# Patient Record
Sex: Male | Born: 1985 | Race: Black or African American | Hispanic: No | Marital: Single | Smoking: Smoker, current status unknown
Health system: Southern US, Community
[De-identification: ages and names within clinical notes are randomized; demographics above are authoritative.]

## PROBLEM LIST (undated history)

## (undated) DIAGNOSIS — F17200 Nicotine dependence, unspecified, uncomplicated: Secondary | ICD-10-CM

---
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
## 2006-02-07 ENCOUNTER — Emergency Department (HOSPITAL_COMMUNITY): Admission: EM | Admit: 2006-02-07 | Discharge: 2006-02-07 | Payer: Self-pay | Admitting: Emergency Medicine

## 2007-05-26 ENCOUNTER — Emergency Department (HOSPITAL_COMMUNITY): Admission: EM | Admit: 2007-05-26 | Discharge: 2007-05-26 | Payer: Self-pay | Admitting: Family Medicine

## 2010-06-15 NOTE — Op Note (Signed)
[place]  Patient:    Jeffrey Haney, Jeffrey Haney                      MRN: [id number] Proc. Date: 05/09/00 Adm. Date:  [id number] Attending:  Carlean Purl                           Operative Report  PREOPERATIVE DIAGNOSIS:  Adenoid hypertrophy with obstructive symptoms.  POSTOPERATIVE DIAGNOSIS:  Adenoid hypertrophy with obstructive symptoms.  PROCEDURE:  Adenoidectomy.  SURGEON:  Kristine Garbe. Ezzard Standing, M.D.  ANESTHESIA:  General endotracheal.  COMPLICATIONS:  None.  BRIEF CLINICAL NOTE:  Jeffrey Haney is a 25 year old male who has had heavy snoring at night, sometimes obstructed breathing at night.  On exam, he has average 2+ size tonsils.  Nasal endoscopy was performed and revealed large obstructing adenoid tissue.  He is taken to the operating room at this time for adenoidectomy.  DESCRIPTION OF PROCEDURE:  After adequate endotracheal anesthesia, [name] received 500 mg of Ancef IV preoperatively.  The mouth gag was used to visualize the oropharynx.  A red rubber catheter was passed through the nose and out the mouth to retract the soft palate, and the nasopharynx was examined.  [name] had large obstructing adenoid tissue.  A large adenoid curette was used to remove the central pad of adenoid tissue.  Nasopharyngeal pack was placed for hemostasis.  This was then removed, and further hemostasis was obtained with suction cautery.  After obtaining adequate hemostasis, the procedure was completed.  [name] was awoken from anesthesia and transferred to recovery postop doing well.  DISPOSITION:  [name] is discharged home later this morning on Tylenol and Tylenol No. 3 p.r.n. pain.  Will have him follow up in my office in two weeks for recheck. DD:  05/09/00 TD:  05/09/00 Job: [id number]

## 2017-08-04 ENCOUNTER — Encounter (HOSPITAL_COMMUNITY): Payer: Self-pay | Admitting: Emergency Medicine

## 2017-08-04 ENCOUNTER — Inpatient Hospital Stay (HOSPITAL_COMMUNITY)
Admission: EM | Admit: 2017-08-04 | Discharge: 2017-08-26 | DRG: 957 | Disposition: A | Discharge status: Home / Self Care | Payer: Self-pay | Attending: General Surgery | Admitting: General Surgery

## 2017-08-04 ENCOUNTER — Other Ambulatory Visit: Payer: Self-pay

## 2017-08-04 DIAGNOSIS — N179 Acute kidney failure, unspecified: Secondary | ICD-10-CM

## 2017-08-04 DIAGNOSIS — S36892A Contusion of other intra-abdominal organs, initial encounter: Secondary | ICD-10-CM | POA: Diagnosis present

## 2017-08-04 DIAGNOSIS — S299XXA Unspecified injury of thorax, initial encounter: Secondary | ICD-10-CM

## 2017-08-04 DIAGNOSIS — J9690 Respiratory failure, unspecified, unspecified whether with hypoxia or hypercapnia: Secondary | ICD-10-CM

## 2017-08-04 DIAGNOSIS — S36039A Unspecified laceration of spleen, initial encounter: Secondary | ICD-10-CM | POA: Diagnosis present

## 2017-08-04 DIAGNOSIS — T148XXA Other injury of unspecified body region, initial encounter: Secondary | ICD-10-CM

## 2017-08-04 DIAGNOSIS — Y838 Other surgical procedures as the cause of abnormal reaction of the patient, or of later complication, without mention of misadventure at the time of the procedure: Secondary | ICD-10-CM | POA: Diagnosis not present

## 2017-08-04 DIAGNOSIS — J80 Acute respiratory distress syndrome: Secondary | ICD-10-CM

## 2017-08-04 DIAGNOSIS — Z720 Tobacco use: Secondary | ICD-10-CM

## 2017-08-04 DIAGNOSIS — S36116A Major laceration of liver, initial encounter: Secondary | ICD-10-CM | POA: Diagnosis present

## 2017-08-04 DIAGNOSIS — S37812A Contusion of adrenal gland, initial encounter: Secondary | ICD-10-CM | POA: Diagnosis present

## 2017-08-04 DIAGNOSIS — R402362 Coma scale, best motor response, obeys commands, at arrival to emergency department: Secondary | ICD-10-CM | POA: Diagnosis present

## 2017-08-04 DIAGNOSIS — S27322A Contusion of lung, bilateral, initial encounter: Secondary | ICD-10-CM | POA: Diagnosis present

## 2017-08-04 DIAGNOSIS — L8990 Pressure ulcer of unspecified site, unspecified stage: Secondary | ICD-10-CM

## 2017-08-04 DIAGNOSIS — Z0189 Encounter for other specified special examinations: Secondary | ICD-10-CM

## 2017-08-04 DIAGNOSIS — Z978 Presence of other specified devices: Secondary | ICD-10-CM

## 2017-08-04 DIAGNOSIS — S272XXA Traumatic hemopneumothorax, initial encounter: Principal | ICD-10-CM | POA: Diagnosis present

## 2017-08-04 DIAGNOSIS — S20319A Abrasion of unspecified front wall of thorax, initial encounter: Secondary | ICD-10-CM | POA: Diagnosis present

## 2017-08-04 DIAGNOSIS — S2242XA Multiple fractures of ribs, left side, initial encounter for closed fracture: Secondary | ICD-10-CM | POA: Diagnosis present

## 2017-08-04 DIAGNOSIS — K913 Postprocedural intestinal obstruction, unspecified as to partial versus complete: Secondary | ICD-10-CM | POA: Diagnosis not present

## 2017-08-04 DIAGNOSIS — S61412A Laceration without foreign body of left hand, initial encounter: Secondary | ICD-10-CM | POA: Diagnosis present

## 2017-08-04 DIAGNOSIS — Z9289 Personal history of other medical treatment: Secondary | ICD-10-CM

## 2017-08-04 DIAGNOSIS — R402252 Coma scale, best verbal response, oriented, at arrival to emergency department: Secondary | ICD-10-CM | POA: Diagnosis present

## 2017-08-04 DIAGNOSIS — F17210 Nicotine dependence, cigarettes, uncomplicated: Secondary | ICD-10-CM | POA: Diagnosis present

## 2017-08-04 DIAGNOSIS — E876 Hypokalemia: Secondary | ICD-10-CM

## 2017-08-04 DIAGNOSIS — R402142 Coma scale, eyes open, spontaneous, at arrival to emergency department: Secondary | ICD-10-CM | POA: Diagnosis present

## 2017-08-04 DIAGNOSIS — D62 Acute posthemorrhagic anemia: Secondary | ICD-10-CM

## 2017-08-04 DIAGNOSIS — Z23 Encounter for immunization: Secondary | ICD-10-CM

## 2017-08-04 DIAGNOSIS — F10239 Alcohol dependence with withdrawal, unspecified: Secondary | ICD-10-CM | POA: Diagnosis present

## 2017-08-04 DIAGNOSIS — S31109A Unspecified open wound of abdominal wall, unspecified quadrant without penetration into peritoneal cavity, initial encounter: Secondary | ICD-10-CM

## 2017-08-04 DIAGNOSIS — R1312 Dysphagia, oropharyngeal phase: Secondary | ICD-10-CM | POA: Diagnosis not present

## 2017-08-04 DIAGNOSIS — F1010 Alcohol abuse, uncomplicated: Secondary | ICD-10-CM

## 2017-08-04 DIAGNOSIS — I10 Essential (primary) hypertension: Secondary | ICD-10-CM

## 2017-08-04 DIAGNOSIS — R578 Other shock: Secondary | ICD-10-CM | POA: Diagnosis not present

## 2017-08-04 DIAGNOSIS — S36113A Laceration of liver, unspecified degree, initial encounter: Secondary | ICD-10-CM

## 2017-08-04 DIAGNOSIS — E874 Mixed disorder of acid-base balance: Secondary | ICD-10-CM | POA: Diagnosis not present

## 2017-08-04 DIAGNOSIS — A412 Sepsis due to unspecified staphylococcus: Secondary | ICD-10-CM | POA: Diagnosis not present

## 2017-08-04 DIAGNOSIS — J939 Pneumothorax, unspecified: Secondary | ICD-10-CM

## 2017-08-04 DIAGNOSIS — F419 Anxiety disorder, unspecified: Secondary | ICD-10-CM | POA: Diagnosis not present

## 2017-08-04 DIAGNOSIS — R000 Tachycardia, unspecified: Secondary | ICD-10-CM

## 2017-08-04 DIAGNOSIS — D696 Thrombocytopenia, unspecified: Secondary | ICD-10-CM | POA: Diagnosis present

## 2017-08-04 DIAGNOSIS — E870 Hyperosmolality and hypernatremia: Secondary | ICD-10-CM | POA: Diagnosis not present

## 2017-08-04 DIAGNOSIS — G9341 Metabolic encephalopathy: Secondary | ICD-10-CM | POA: Diagnosis not present

## 2017-08-04 DIAGNOSIS — S3600XA Unspecified injury of spleen, initial encounter: Secondary | ICD-10-CM

## 2017-08-04 DIAGNOSIS — R680 Hypothermia, not associated with low environmental temperature: Secondary | ICD-10-CM | POA: Diagnosis not present

## 2017-08-04 DIAGNOSIS — J15212 Pneumonia due to Methicillin resistant Staphylococcus aureus: Secondary | ICD-10-CM | POA: Diagnosis not present

## 2017-08-04 DIAGNOSIS — R0682 Tachypnea, not elsewhere classified: Secondary | ICD-10-CM

## 2017-08-04 DIAGNOSIS — R0902 Hypoxemia: Secondary | ICD-10-CM

## 2017-08-04 DIAGNOSIS — D72829 Elevated white blood cell count, unspecified: Secondary | ICD-10-CM

## 2017-08-04 HISTORY — DX: Nicotine dependence, unspecified, uncomplicated: F17.200

## 2017-08-04 LAB — CBC
HCT: 36.4 % — ABNORMAL LOW (ref 39.0–52.0)
HEMOGLOBIN: 11.7 g/dL — AB (ref 13.0–17.0)
MCH: 31.4 pg (ref 26.0–34.0)
MCHC: 32.1 g/dL (ref 30.0–36.0)
MCV: 97.6 fL (ref 78.0–100.0)
Platelets: 199 10*3/uL (ref 150–400)
RBC: 3.73 MIL/uL — ABNORMAL LOW (ref 4.22–5.81)
RDW: 13.5 % (ref 11.5–15.5)
WBC: 9.7 10*3/uL (ref 4.0–10.5)

## 2017-08-04 LAB — I-STAT CHEM 8, ED
BUN: 20 mg/dL (ref 6–20)
CHLORIDE: 105 mmol/L (ref 98–111)
CREATININE: 1.4 mg/dL — AB (ref 0.61–1.24)
Calcium, Ion: 1.02 mmol/L — ABNORMAL LOW (ref 1.15–1.40)
Glucose, Bld: 152 mg/dL — ABNORMAL HIGH (ref 70–99)
HCT: 36 % — ABNORMAL LOW (ref 39.0–52.0)
Hemoglobin: 12.2 g/dL — ABNORMAL LOW (ref 13.0–17.0)
POTASSIUM: 2.7 mmol/L — AB (ref 3.5–5.1)
Sodium: 141 mmol/L (ref 135–145)
TCO2: 20 mmol/L — ABNORMAL LOW (ref 22–32)

## 2017-08-04 LAB — I-STAT CG4 LACTIC ACID, ED: Lactic Acid, Venous: 3.59 mmol/L (ref 0.5–1.9)

## 2017-08-04 LAB — PROTIME-INR
INR: 1.12
Prothrombin Time: 14.3 seconds (ref 11.4–15.2)

## 2017-08-04 LAB — SAMPLE TO BLOOD BANK

## 2017-08-04 LAB — ETHANOL: Alcohol, Ethyl (B): 213 mg/dL — ABNORMAL HIGH (ref <10)
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
## 2017-08-04 NOTE — ED Notes (Signed)
Critical results given to Dr. Blinda LeatherwoodPollina
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
## 2017-08-05 ENCOUNTER — Emergency Department (HOSPITAL_COMMUNITY): Payer: Self-pay | Admitting: Certified Registered"

## 2017-08-05 ENCOUNTER — Emergency Department (HOSPITAL_COMMUNITY): Payer: Self-pay

## 2017-08-05 ENCOUNTER — Inpatient Hospital Stay (HOSPITAL_COMMUNITY): Payer: Self-pay

## 2017-08-05 ENCOUNTER — Encounter (HOSPITAL_COMMUNITY): Admission: EM | Disposition: A | Discharge status: Home / Self Care | Payer: Self-pay | Source: Home / Self Care

## 2017-08-05 HISTORY — PX: LAPAROTOMY: SHX154

## 2017-08-05 LAB — DIC (DISSEMINATED INTRAVASCULAR COAGULATION)PANEL
INR: 1.21
Platelets: 69 10*3/uL — ABNORMAL LOW (ref 150–400)
Prothrombin Time: 15.2 seconds (ref 11.4–15.2)
Smear Review: NONE SEEN
aPTT: 29 seconds (ref 24–36)

## 2017-08-05 LAB — BASIC METABOLIC PANEL
Anion gap: 14 (ref 5–15)
BUN: 14 mg/dL (ref 6–20)
CHLORIDE: 111 mmol/L (ref 98–111)
CO2: 17 mmol/L — ABNORMAL LOW (ref 22–32)
CREATININE: 1.12 mg/dL (ref 0.61–1.24)
Calcium: 6.5 mg/dL — ABNORMAL LOW (ref 8.9–10.3)
GFR calc Af Amer: >60 mL/min (ref >60)
GLUCOSE: 100 mg/dL — AB (ref 70–99)
Potassium: 3.4 mmol/L — ABNORMAL LOW (ref 3.5–5.1)
SODIUM: 142 mmol/L (ref 135–145)

## 2017-08-05 LAB — POCT I-STAT 7, (LYTES, BLD GAS, ICA,H+H)
ACID-BASE DEFICIT: 15 mmol/L — AB (ref 0.0–2.0)
Acid-base deficit: 9 mmol/L — ABNORMAL HIGH (ref 0.0–2.0)
Bicarbonate: 15.5 mmol/L — ABNORMAL LOW (ref 20.0–28.0)
Bicarbonate: 18.8 mmol/L — ABNORMAL LOW (ref 20.0–28.0)
CALCIUM ION: 0.54 mmol/L — AB (ref 1.15–1.40)
CALCIUM ION: 0.86 mmol/L — AB (ref 1.15–1.40)
HEMATOCRIT: 25 % — AB (ref 39.0–52.0)
HEMATOCRIT: 31 % — AB (ref 39.0–52.0)
Hemoglobin: 8.5 g/dL — ABNORMAL LOW (ref 13.0–17.0)
Hemoglobin: 10.5 g/dL — ABNORMAL LOW (ref 13.0–17.0)
O2 SAT: 100 %
O2 Saturation: 100 %
PCO2 ART: 45.7 mmHg (ref 32.0–48.0)
PH ART: 7.014 — AB (ref 7.350–7.450)
POTASSIUM: 3.6 mmol/L (ref 3.5–5.1)
Potassium: 3.9 mmol/L (ref 3.5–5.1)
SODIUM: 147 mmol/L — AB (ref 135–145)
SODIUM: 145 mmol/L (ref 135–145)
TCO2: 17 mmol/L — AB (ref 22–32)
TCO2: 20 mmol/L — AB (ref 22–32)
pCO2 arterial: 60.9 mmHg — ABNORMAL HIGH (ref 32.0–48.0)
pH, Arterial: 7.212 — ABNORMAL LOW (ref 7.350–7.450)
pO2, Arterial: 323 mmHg — ABNORMAL HIGH (ref 83.0–108.0)
pO2, Arterial: 434 mmHg — ABNORMAL HIGH (ref 83.0–108.0)

## 2017-08-05 LAB — POCT I-STAT 4, (NA,K, GLUC, HGB,HCT)
Glucose, Bld: 138 mg/dL — ABNORMAL HIGH (ref 70–99)
Glucose, Bld: 134 mg/dL — ABNORMAL HIGH (ref 70–99)
HCT: 29 % — ABNORMAL LOW (ref 39.0–52.0)
HEMATOCRIT: 23 % — AB (ref 39.0–52.0)
HEMOGLOBIN: 7.8 g/dL — AB (ref 13.0–17.0)
Hemoglobin: 9.9 g/dL — ABNORMAL LOW (ref 13.0–17.0)
Potassium: 3.8 mmol/L (ref 3.5–5.1)
Potassium: 3.5 mmol/L (ref 3.5–5.1)
SODIUM: 145 mmol/L (ref 135–145)
Sodium: 146 mmol/L — ABNORMAL HIGH (ref 135–145)

## 2017-08-05 LAB — URINALYSIS, ROUTINE W REFLEX MICROSCOPIC
BILIRUBIN URINE: NEGATIVE
Glucose, UA: NEGATIVE mg/dL
KETONES UR: NEGATIVE mg/dL
LEUKOCYTES UA: NEGATIVE
NITRITE: NEGATIVE
Protein, ur: 100 mg/dL — AB
RBC / HPF: >50 RBC/hpf — ABNORMAL HIGH (ref 0–5)
SPECIFIC GRAVITY, URINE: 1.018 (ref 1.005–1.030)
pH: 5 (ref 5.0–8.0)

## 2017-08-05 LAB — BLOOD PRODUCT ORDER (VERBAL) VERIFICATION

## 2017-08-05 LAB — CBC
HCT: 37.7 % — ABNORMAL LOW (ref 39.0–52.0)
HCT: 35.8 % — ABNORMAL LOW (ref 39.0–52.0)
HEMATOCRIT: 37.2 % — AB (ref 39.0–52.0)
HEMOGLOBIN: 12.7 g/dL — AB (ref 13.0–17.0)
Hemoglobin: 12.9 g/dL — ABNORMAL LOW (ref 13.0–17.0)
Hemoglobin: 13 g/dL (ref 13.0–17.0)
MCH: 31.4 pg (ref 26.0–34.0)
MCH: 30.4 pg (ref 26.0–34.0)
MCH: 30.9 pg (ref 26.0–34.0)
MCHC: 34.2 g/dL (ref 30.0–36.0)
MCHC: 34.9 g/dL (ref 30.0–36.0)
MCHC: 35.5 g/dL (ref 30.0–36.0)
MCV: 91.7 fL (ref 78.0–100.0)
MCV: 87.1 fL (ref 78.0–100.0)
MCV: 87.1 fL (ref 78.0–100.0)
PLATELETS: 63 10*3/uL — AB (ref 150–400)
Platelets: 116 10*3/uL — ABNORMAL LOW (ref 150–400)
Platelets: 102 10*3/uL — ABNORMAL LOW (ref 150–400)
RBC: 4.11 MIL/uL — ABNORMAL LOW (ref 4.22–5.81)
RBC: 4.27 MIL/uL (ref 4.22–5.81)
RBC: 4.11 MIL/uL — ABNORMAL LOW (ref 4.22–5.81)
RDW: 13.4 % (ref 11.5–15.5)
RDW: 14.1 % (ref 11.5–15.5)
RDW: 14.4 % (ref 11.5–15.5)
WBC: 12.5 10*3/uL — ABNORMAL HIGH (ref 4.0–10.5)
WBC: 8.5 10*3/uL (ref 4.0–10.5)
WBC: 7.1 10*3/uL (ref 4.0–10.5)

## 2017-08-05 LAB — COMPREHENSIVE METABOLIC PANEL
ALK PHOS: 67 U/L (ref 38–126)
ALT: 536 U/L — AB (ref 0–44)
AST: 1096 U/L — ABNORMAL HIGH (ref 15–41)
Albumin: 3.7 g/dL (ref 3.5–5.0)
Anion gap: 10 (ref 5–15)
BUN: 15 mg/dL (ref 6–20)
CHLORIDE: 108 mmol/L (ref 98–111)
CO2: 20 mmol/L — ABNORMAL LOW (ref 22–32)
CREATININE: 1.39 mg/dL — AB (ref 0.61–1.24)
Calcium: 7.9 mg/dL — ABNORMAL LOW (ref 8.9–10.3)
GFR calc non Af Amer: >60 mL/min (ref >60)
GLUCOSE: 155 mg/dL — AB (ref 70–99)
Potassium: 3.7 mmol/L (ref 3.5–5.1)
Sodium: 138 mmol/L (ref 135–145)
Total Bilirubin: 1.6 mg/dL — ABNORMAL HIGH (ref 0.3–1.2)
Total Protein: 6.3 g/dL — ABNORMAL LOW (ref 6.5–8.1)

## 2017-08-05 LAB — DIC (DISSEMINATED INTRAVASCULAR COAGULATION) PANEL
APTT: 29 s (ref 24–36)
FIBRINOGEN: 237 mg/dL (ref 210–475)
FIBRINOGEN: 277 mg/dL (ref 210–475)
INR: 1.2
PLATELETS: 115 10*3/uL — AB (ref 150–400)
PROTHROMBIN TIME: 15.1 s (ref 11.4–15.2)
SMEAR REVIEW: NONE SEEN

## 2017-08-05 LAB — LACTIC ACID, PLASMA
LACTIC ACID, VENOUS: 1.6 mmol/L (ref 0.5–1.9)
Lactic Acid, Venous: 6.7 mmol/L (ref 0.5–1.9)

## 2017-08-05 LAB — POCT I-STAT 3, ART BLOOD GAS (G3+)
Acid-base deficit: 9 mmol/L — ABNORMAL HIGH (ref 0.0–2.0)
Bicarbonate: 18 mmol/L — ABNORMAL LOW (ref 20.0–28.0)
O2 Saturation: 100 %
PCO2 ART: 41.6 mmHg (ref 32.0–48.0)
PH ART: 7.241 — AB (ref 7.350–7.450)
PO2 ART: 385 mmHg — AB (ref 83.0–108.0)
Patient temperature: 97.9
TCO2: 19 mmol/L — ABNORMAL LOW (ref 22–32)

## 2017-08-05 LAB — HIV ANTIBODY (ROUTINE TESTING W REFLEX): HIV Screen 4th Generation wRfx: NONREACTIVE

## 2017-08-05 LAB — MASSIVE TRANSFUSION PROTOCOL ORDER (BLOOD BANK NOTIFICATION)

## 2017-08-05 LAB — TRIGLYCERIDES: TRIGLYCERIDES: 94 mg/dL (ref <150)

## 2017-08-05 LAB — MRSA PCR SCREENING: MRSA BY PCR: NEGATIVE

## 2017-08-05 LAB — ABO/RH: ABO/RH(D): B POS

## 2017-08-05 LAB — CDS SEROLOGY

## 2017-08-05 SURGERY — LAPAROTOMY, EXPLORATORY
Anesthesia: General | Site: Abdomen

## 2017-08-05 MED ORDER — FENTANYL CITRATE (PF) 100 MCG/2ML IJ SOLN
INTRAMUSCULAR | Status: DC | PRN
  Administered 2017-08-05: 100 ug via INTRAVENOUS
  Administered 2017-08-05: 50 ug via INTRAVENOUS
  Administered 2017-08-05: 100 ug via INTRAVENOUS

## 2017-08-05 MED ORDER — SODIUM CHLORIDE 0.9 % IV SOLN
INTRAVENOUS | Status: DC | PRN
  Administered 2017-08-05 (×2): via INTRAVENOUS

## 2017-08-05 MED ORDER — PHENYLEPHRINE 40 MCG/ML (10ML) SYRINGE FOR IV PUSH (FOR BLOOD PRESSURE SUPPORT)
PREFILLED_SYRINGE | INTRAVENOUS | Status: AC
  Filled 2017-08-05: qty 30

## 2017-08-05 MED ORDER — FENTANYL CITRATE (PF) 250 MCG/5ML IJ SOLN
INTRAMUSCULAR | Status: AC
  Filled 2017-08-05: qty 5

## 2017-08-05 MED ORDER — SUCCINYLCHOLINE CHLORIDE 20 MG/ML IJ SOLN
INTRAMUSCULAR | Status: DC | PRN
  Administered 2017-08-05: 70 mg via INTRAVENOUS

## 2017-08-05 MED ORDER — SODIUM BICARBONATE 8.4 % IV SOLN
INTRAVENOUS | Status: DC | PRN
  Administered 2017-08-05: 50 mL via INTRAVENOUS

## 2017-08-05 MED ORDER — FENTANYL CITRATE (PF) 100 MCG/2ML IJ SOLN: 50.0000 ug | Freq: Once | INTRAMUSCULAR | Status: DC

## 2017-08-05 MED ORDER — ROCURONIUM BROMIDE 10 MG/ML (PF) SYRINGE
PREFILLED_SYRINGE | INTRAVENOUS | Status: AC
  Filled 2017-08-05: qty 10

## 2017-08-05 MED ORDER — SUCCINYLCHOLINE CHLORIDE 200 MG/10ML IV SOSY
PREFILLED_SYRINGE | INTRAVENOUS | Status: AC
  Filled 2017-08-05: qty 10

## 2017-08-05 MED ORDER — SODIUM CHLORIDE 0.9% FLUSH
10.0000 mL | INTRAVENOUS | Status: DC | PRN
  Administered 2017-08-10: 10 mL
  Filled 2017-08-05: qty 40

## 2017-08-05 MED ORDER — CHLORHEXIDINE GLUCONATE 0.12 % MT SOLN
OROMUCOSAL | Status: AC
  Filled 2017-08-05: qty 15

## 2017-08-05 MED ORDER — SODIUM CHLORIDE 0.9 % IV SOLN
INTRAVENOUS | Status: DC
  Administered 2017-08-05: 05:00:00 via INTRAVENOUS

## 2017-08-05 MED ORDER — PROPOFOL 1000 MG/100ML IV EMUL
INTRAVENOUS | Status: AC
  Filled 2017-08-05: qty 100

## 2017-08-05 MED ORDER — SODIUM CHLORIDE 0.9% IV SOLUTION: Freq: Once | INTRAVENOUS | Status: DC

## 2017-08-05 MED ORDER — KCL IN DEXTROSE-NACL 20-5-0.45 MEQ/L-%-% IV SOLN
INTRAVENOUS | Status: DC
  Administered 2017-08-05: 20:00:00 via INTRAVENOUS
  Administered 2017-08-05: 100 mL/h via INTRAVENOUS
  Administered 2017-08-06 – 2017-08-09 (×5): via INTRAVENOUS
  Filled 2017-08-05 (×10): qty 1000

## 2017-08-05 MED ORDER — SODIUM CHLORIDE 0.9 % IJ SOLN
INTRAMUSCULAR | Status: AC
  Filled 2017-08-05: qty 10

## 2017-08-05 MED ORDER — ROCURONIUM BROMIDE 100 MG/10ML IV SOLN
INTRAVENOUS | Status: DC | PRN
  Administered 2017-08-05 (×2): 50 mg via INTRAVENOUS

## 2017-08-05 MED ORDER — POTASSIUM CHLORIDE 2 MEQ/ML IV SOLN
INTRAVENOUS | Status: DC
  Filled 2017-08-05: qty 1000

## 2017-08-05 MED ORDER — PROPOFOL 1000 MG/100ML IV EMUL
0.0000 ug/kg/min | INTRAVENOUS | Status: DC
  Administered 2017-08-05: 50 ug/kg/min via INTRAVENOUS

## 2017-08-05 MED ORDER — IOHEXOL 300 MG/ML  SOLN
100.0000 mL | Freq: Once | INTRAMUSCULAR | Status: AC | PRN
  Administered 2017-08-05: 100 mL via INTRAVENOUS

## 2017-08-05 MED ORDER — PHENYLEPHRINE HCL 10 MG/ML IJ SOLN
INTRAMUSCULAR | Status: DC | PRN
  Administered 2017-08-05: 80 ug via INTRAVENOUS

## 2017-08-05 MED ORDER — CEFAZOLIN SODIUM 1 G IJ SOLR
INTRAMUSCULAR | Status: AC
  Filled 2017-08-05: qty 20

## 2017-08-05 MED ORDER — PHENYLEPHRINE HCL-NACL 10-0.9 MG/250ML-% IV SOLN
0.0000 ug/min | INTRAVENOUS | Status: DC
  Administered 2017-08-05: 20 ug/min via INTRAVENOUS
  Administered 2017-08-06: 40 ug/min via INTRAVENOUS
  Filled 2017-08-05 (×3): qty 250

## 2017-08-05 MED ORDER — DIPHENHYDRAMINE HCL 50 MG/ML IJ SOLN
INTRAMUSCULAR | Status: AC
  Filled 2017-08-05: qty 1

## 2017-08-05 MED ORDER — ALBUMIN HUMAN 5 % IV SOLN
25.0000 g | Freq: Once | INTRAVENOUS | Status: AC
  Administered 2017-08-05: 25 g via INTRAVENOUS
  Filled 2017-08-05: qty 250

## 2017-08-05 MED ORDER — BISACODYL 10 MG RE SUPP
10.0000 mg | Freq: Every day | RECTAL | Status: DC | PRN
  Administered 2017-08-13 – 2017-08-18 (×2): 10 mg via RECTAL
  Filled 2017-08-05 (×4): qty 1

## 2017-08-05 MED ORDER — ALBUMIN HUMAN 5 % IV SOLN
25.0000 g | Freq: Once | INTRAVENOUS | Status: AC
  Administered 2017-08-05: 25 g via INTRAVENOUS
  Filled 2017-08-05: qty 500

## 2017-08-05 MED ORDER — 0.9 % SODIUM CHLORIDE (POUR BTL) OPTIME
TOPICAL | Status: DC | PRN
  Administered 2017-08-05 (×2): 1000 mL

## 2017-08-05 MED ORDER — MIDAZOLAM HCL 2 MG/2ML IJ SOLN
INTRAMUSCULAR | Status: AC
  Filled 2017-08-05: qty 2

## 2017-08-05 MED ORDER — HALOPERIDOL LACTATE 5 MG/ML IJ SOLN
5.0000 mg | Freq: Once | INTRAMUSCULAR | Status: AC
  Administered 2017-08-05: 5 mg via INTRAVENOUS
  Filled 2017-08-05: qty 1

## 2017-08-05 MED ORDER — LACTATED RINGERS IV SOLN
INTRAVENOUS | Status: DC | PRN
  Administered 2017-08-05: 03:00:00 via INTRAVENOUS

## 2017-08-05 MED ORDER — POTASSIUM CHLORIDE 10 MEQ/50ML IV SOLN
10.0000 meq | INTRAVENOUS | Status: AC
  Administered 2017-08-05 (×2): 10 meq via INTRAVENOUS
  Filled 2017-08-05 (×2): qty 50

## 2017-08-05 MED ORDER — PROPOFOL 1000 MG/100ML IV EMUL
0.0000 ug/kg/min | INTRAVENOUS | Status: DC
  Administered 2017-08-05 (×5): 50 ug/kg/min via INTRAVENOUS
  Administered 2017-08-06: 35 ug/kg/min via INTRAVENOUS
  Administered 2017-08-06: 50 ug/kg/min via INTRAVENOUS
  Administered 2017-08-06: 25 ug/kg/min via INTRAVENOUS
  Administered 2017-08-06 (×2): 50 ug/kg/min via INTRAVENOUS
  Administered 2017-08-07: 15 ug/kg/min via INTRAVENOUS
  Filled 2017-08-05 (×10): qty 100

## 2017-08-05 MED ORDER — FENTANYL 2500MCG IN NS 250ML (10MCG/ML) PREMIX INFUSION
25.0000 ug/h | INTRAVENOUS | Status: DC
  Administered 2017-08-05: 250 ug/h via INTRAVENOUS
  Administered 2017-08-05: 50 ug/h via INTRAVENOUS
  Administered 2017-08-06: 275 ug/h via INTRAVENOUS
  Administered 2017-08-06 (×2): 250 ug/h via INTRAVENOUS
  Administered 2017-08-07: 400 ug/h via INTRAVENOUS
  Filled 2017-08-05 (×6): qty 250

## 2017-08-05 MED ORDER — DIPHENHYDRAMINE HCL 50 MG/ML IJ SOLN
INTRAMUSCULAR | Status: DC | PRN
  Administered 2017-08-05: 50 mg via INTRAVENOUS

## 2017-08-05 MED ORDER — BACITRACIN ZINC 500 UNIT/GM EX OINT
TOPICAL_OINTMENT | CUTANEOUS | Status: AC
  Filled 2017-08-05: qty 28.35

## 2017-08-05 MED ORDER — LORAZEPAM 2 MG/ML IJ SOLN
1.0000 mg | Freq: Once | INTRAMUSCULAR | Status: AC
Start: 2017-08-05 — End: 2017-08-05
  Administered 2017-08-05: 1 mg via INTRAVENOUS
  Filled 2017-08-05: qty 1

## 2017-08-05 MED ORDER — TETANUS-DIPHTH-ACELL PERTUSSIS 5-2.5-18.5 LF-MCG/0.5 IM SUSP
0.5000 mL | Freq: Once | INTRAMUSCULAR | Status: AC
  Administered 2017-08-05: 0.5 mL via INTRAMUSCULAR
  Filled 2017-08-05: qty 0.5

## 2017-08-05 MED ORDER — ALBUMIN HUMAN 5 % IV SOLN
INTRAVENOUS | Status: AC
  Filled 2017-08-05: qty 250

## 2017-08-05 MED ORDER — SODIUM CHLORIDE 0.9 % IV SOLN
INTRAVENOUS | Status: AC | PRN
  Administered 2017-08-05 (×2): 1000 mL via INTRAVENOUS

## 2017-08-05 MED ORDER — ORAL CARE MOUTH RINSE
15.0000 mL | OROMUCOSAL | Status: DC
  Administered 2017-08-05 – 2017-08-20 (×149): 15 mL via OROMUCOSAL

## 2017-08-05 MED ORDER — PROPOFOL 10 MG/ML IV BOLUS
INTRAVENOUS | Status: AC
  Filled 2017-08-05: qty 20

## 2017-08-05 MED ORDER — SODIUM CHLORIDE 0.9 % IV SOLN
INTRAVENOUS | Status: DC | PRN
  Administered 2017-08-05: 03:00:00 via INTRAVENOUS

## 2017-08-05 MED ORDER — PROPOFOL 10 MG/ML IV BOLUS
INTRAVENOUS | Status: DC | PRN
  Administered 2017-08-05: 30 mg via INTRAVENOUS

## 2017-08-05 MED ORDER — SODIUM CHLORIDE 0.9% FLUSH
10.0000 mL | Freq: Two times a day (BID) | INTRAVENOUS | Status: DC
  Administered 2017-08-05 – 2017-08-06 (×3): 10 mL
  Administered 2017-08-06: 20 mL
  Administered 2017-08-07 (×2): 10 mL
  Administered 2017-08-08: 30 mL
  Administered 2017-08-08 – 2017-08-09 (×2): 10 mL
  Administered 2017-08-09: 30 mL
  Administered 2017-08-12 – 2017-08-18 (×9): 10 mL
  Administered 2017-08-20: 30 mL
  Administered 2017-08-21: 10 mL
  Administered 2017-08-22: 20 mL
  Administered 2017-08-22: 10 mL
  Administered 2017-08-23: 30 mL
  Administered 2017-08-24 – 2017-08-25 (×2): 10 mL

## 2017-08-05 MED ORDER — ONDANSETRON HCL 4 MG/2ML IJ SOLN
4.0000 mg | Freq: Four times a day (QID) | INTRAMUSCULAR | Status: DC | PRN
  Administered 2017-08-20: 4 mg via INTRAVENOUS
  Filled 2017-08-05: qty 2

## 2017-08-05 MED ORDER — DOCUSATE SODIUM 50 MG/5ML PO LIQD
100.0000 mg | Freq: Two times a day (BID) | ORAL | Status: DC | PRN
  Administered 2017-08-09 – 2017-08-17 (×6): 100 mg
  Filled 2017-08-05 (×6): qty 10

## 2017-08-05 MED ORDER — PROPOFOL 500 MG/50ML IV EMUL
INTRAVENOUS | Status: DC | PRN
  Administered 2017-08-05: 50 ug/kg/min via INTRAVENOUS

## 2017-08-05 MED ORDER — FENTANYL BOLUS VIA INFUSION
50.0000 ug | INTRAVENOUS | Status: DC | PRN
  Filled 2017-08-05: qty 50

## 2017-08-05 MED ORDER — CHLORHEXIDINE GLUCONATE 0.12% ORAL RINSE (MEDLINE KIT)
15.0000 mL | Freq: Two times a day (BID) | OROMUCOSAL | Status: DC
  Administered 2017-08-05 – 2017-08-20 (×31): 15 mL via OROMUCOSAL

## 2017-08-05 MED ORDER — CHLORHEXIDINE GLUCONATE CLOTH 2 % EX PADS
6.0000 | MEDICATED_PAD | Freq: Every day | CUTANEOUS | Status: DC
  Administered 2017-08-05 – 2017-08-25 (×18): 6 via TOPICAL

## 2017-08-05 MED ORDER — SODIUM BICARBONATE 8.4 % IV SOLN
INTRAVENOUS | Status: AC
  Filled 2017-08-05: qty 50

## 2017-08-05 MED ORDER — ONDANSETRON 4 MG PO TBDP
4.0000 mg | ORAL_TABLET | Freq: Four times a day (QID) | ORAL | Status: DC | PRN
  Administered 2017-08-22: 4 mg via ORAL
  Filled 2017-08-05: qty 1

## 2017-08-05 MED ORDER — MIDAZOLAM HCL 5 MG/5ML IJ SOLN
INTRAMUSCULAR | Status: DC | PRN
  Administered 2017-08-05 (×2): 2 mg via INTRAVENOUS

## 2017-08-05 SURGICAL SUPPLY — 55 items
BLADE CLIPPER SURG (BLADE) IMPLANT
BNDG CONFORM 3 STRL LF (GAUZE/BANDAGES/DRESSINGS) ×2 IMPLANT
BNDG GAUZE ELAST 4 BULKY (GAUZE/BANDAGES/DRESSINGS) ×2 IMPLANT
CANISTER SUCT 3000ML PPV (MISCELLANEOUS) ×3 IMPLANT
CATH THORACIC 28FR (CATHETERS) ×2 IMPLANT
CHLORAPREP W/TINT 26ML (MISCELLANEOUS) ×3 IMPLANT
COVER SURGICAL LIGHT HANDLE (MISCELLANEOUS) ×3 IMPLANT
DRAPE LAPAROSCOPIC ABDOMINAL (DRAPES) ×3 IMPLANT
DRAPE WARM FLUID 44X44 (DRAPE) ×3 IMPLANT
DRSG OPSITE 6X11 MED (GAUZE/BANDAGES/DRESSINGS) ×2 IMPLANT
DRSG OPSITE POSTOP 4X10 (GAUZE/BANDAGES/DRESSINGS) IMPLANT
DRSG OPSITE POSTOP 4X8 (GAUZE/BANDAGES/DRESSINGS) IMPLANT
DURAPREP 26ML APPLICATOR (WOUND CARE) ×2 IMPLANT
ELECT BLADE 6.5 EXT (BLADE) IMPLANT
ELECT CAUTERY BLADE 6.4 (BLADE) IMPLANT
ELECT REM PT RETURN 9FT ADLT (ELECTROSURGICAL) ×3
ELECTRODE REM PT RTRN 9FT ADLT (ELECTROSURGICAL) ×1 IMPLANT
GAUZE SPONGE 4X4 12PLY STRL (GAUZE/BANDAGES/DRESSINGS) ×4 IMPLANT
GLOVE BIO SURGEON STRL SZ7 (GLOVE) ×3 IMPLANT
GLOVE BIOGEL PI IND STRL 7.0 (GLOVE) IMPLANT
GLOVE BIOGEL PI IND STRL 7.5 (GLOVE) ×1 IMPLANT
GLOVE BIOGEL PI INDICATOR 7.0 (GLOVE) ×8
GLOVE BIOGEL PI INDICATOR 7.5 (GLOVE) ×2
GOWN STRL REUS W/ TWL LRG LVL3 (GOWN DISPOSABLE) ×2 IMPLANT
GOWN STRL REUS W/TWL LRG LVL3 (GOWN DISPOSABLE) ×6
HEMOSTAT SNOW SURGICEL 2X4 (HEMOSTASIS) ×2 IMPLANT
HEMOSTAT SURGICEL 2X14 (HEMOSTASIS) ×2 IMPLANT
KIT BASIN OR (CUSTOM PROCEDURE TRAY) ×3 IMPLANT
KIT TURNOVER KIT B (KITS) ×3 IMPLANT
LIGASURE IMPACT 36 18CM CVD LR (INSTRUMENTS) IMPLANT
NS IRRIG 1000ML POUR BTL (IV SOLUTION) ×6 IMPLANT
PACK GENERAL/GYN (CUSTOM PROCEDURE TRAY) ×3 IMPLANT
PAD ARMBOARD 7.5X6 YLW CONV (MISCELLANEOUS) ×3 IMPLANT
SPECIMEN JAR LARGE (MISCELLANEOUS) IMPLANT
SPONGE ABDOMINAL VAC ABTHERA (MISCELLANEOUS) ×2 IMPLANT
SPONGE LAP 18X18 X RAY DECT (DISPOSABLE) ×12 IMPLANT
STAPLER VISISTAT 35W (STAPLE) ×3 IMPLANT
SUCTION POOLE TIP (SUCTIONS) ×3 IMPLANT
SUT ETHILON 4 0 PS 2 18 (SUTURE) ×4 IMPLANT
SUT PDS AB 1 TP1 96 (SUTURE) ×6 IMPLANT
SUT SILK 2 0 (SUTURE) ×3
SUT SILK 2 0 SH CR/8 (SUTURE) ×5 IMPLANT
SUT SILK 2 0 TIES 10X30 (SUTURE) ×4 IMPLANT
SUT SILK 2-0 18XBRD TIE 12 (SUTURE) ×1 IMPLANT
SUT SILK 3 0 (SUTURE) ×3
SUT SILK 3 0 SH CR/8 (SUTURE) ×5 IMPLANT
SUT SILK 3 0 TIES 10X30 (SUTURE) ×4 IMPLANT
SUT SILK 3-0 18XBRD TIE 12 (SUTURE) ×1 IMPLANT
SUT VIC AB 3-0 SH 27 (SUTURE)
SUT VIC AB 3-0 SH 27X BRD (SUTURE) IMPLANT
SYSTEM SAHARA CHEST DRAIN ATS (WOUND CARE) ×2 IMPLANT
TOWEL OR 17X26 10 PK STRL BLUE (TOWEL DISPOSABLE) ×3 IMPLANT
TRAY FOLEY MTR SLVR 16FR STAT (SET/KITS/TRAYS/PACK) ×3 IMPLANT
WND VAC CANISTER 500ML (MISCELLANEOUS) ×2 IMPLANT
YANKAUER SUCT BULB TIP NO VENT (SUCTIONS) IMPLANT

## 2017-08-05 NOTE — Clinical Social Work Note (Signed)
Clinical Social Worker received notification from MD that patient family had not been located and unable to be reached.  CSW utilized multiple resources and was able to locate patient mother, Jeffrey Haney and patient brother, Jeffrey Haney.  Both live locally and are on the way to the hospital for updates from MD - MD/RN notified.  CSW updated facesheet with accurate contact information for both Jeffrey Haney and Jeffrey Haney.  Patient currently intubated - CSW to follow up with full assessment once appropriate.  CSW available for support as needed to staff and family.  [name], Kentucky[place]CS[place] [phone]

## 2017-08-05 NOTE — Code Documentation (Signed)
Pt BP 63/35
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
## 2017-08-05 NOTE — Progress Notes (Signed)
Received patient from OR via bag valve mask. Placed on mechanical ventilation 688ml/kg per order, R 18 FIO2 100%, peep 5. ETT secured  22 at teeth with pink tape. Secured with ETT holder, 22 at teeth (24 @ lips). ABG drawn and decreased FIO2 to 30%. AMBU bag @ HOB.

## 2017-08-05 NOTE — Anesthesia Procedure Notes (Signed)
Central Venous Catheter Insertion Performed by: Oleta Mouse, MD, anesthesiologist Start/End7/09/2017 2:49 AM, 08/05/2017 2:54 AM Patient location: OR. Preanesthetic checklist: patient identified, IV checked, site marked, risks and benefits discussed, surgical consent, monitors and equipment checked, pre-op evaluation, timeout performed and anesthesia consent Position: supine Hand hygiene performed  and maximum sterile barriers used  Catheter size: 9 Fr Total catheter length 10. MAC introducer Procedure performed without using ultrasound guided technique. Attempts: 1 Following insertion, line sutured, dressing applied and Biopatch. Post procedure assessment: blood return through all ports, free fluid flow and no air  Patient tolerated the procedure well with no immediate complications.
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
## 2017-08-05 NOTE — Transfer of Care (Signed)
Immediate Anesthesia Transfer of Care Note  Patient: Jeffrey Haney  Procedure(s) Performed: EXPLORATORY LAPAROTOMY; SPLEENECTOMY, CHEST TUBE INSERTION (N/A )  Patient Location: ICU  Anesthesia Type:General  Level of Consciousness: Patient remains intubated per anesthesia plan  Airway & Oxygen Therapy: Patient remains intubated per anesthesia plan and Patient placed on Ventilator (see vital sign flow sheet for setting)  Post-op Assessment: Report given to RN and Post -op Vital signs reviewed and stable  Post vital signs: Reviewed and stable  Last Vitals:  Vitals Value Taken Time  BP 147/84 08/05/2017  4:30 AM  Temp    Pulse 92 08/05/2017  4:33 AM  Resp 19 08/05/2017  4:33 AM  SpO2 100 % 08/05/2017  4:33 AM  Vitals shown include unvalidated device data.  Last Pain:  Vitals:   08/05/17 0217  TempSrc: Rectal         Complications: No apparent anesthesia complications

## 2017-08-05 NOTE — Anesthesia Postprocedure Evaluation (Signed)
Anesthesia Post Note  Patient: Jeffrey GottronDavid Haney  Procedure(s) Performed: EXPLORATORY LAPAROTOMY; SPLEENECTOMY, CHEST TUBE INSERTION LEFT SIDE (N/A Abdomen)     Patient location during evaluation: SICU Anesthesia Type: General Level of consciousness: sedated Pain management: pain level controlled Vital Signs Assessment: post-procedure vital signs reviewed and stable Respiratory status: patient remains intubated per anesthesia plan Cardiovascular status: stable Postop Assessment: no apparent nausea or vomiting Anesthetic complications: no    Last Vitals:  Vitals:   08/05/17 0600 08/05/17 0630  BP: (!) 147/88 (!) 143/91  Pulse: 93 93  Resp: 19 18  Temp: 37.2 C 37 C  SpO2: 99% 100%    Last Pain:  Vitals:   08/05/17 0630  TempSrc: Oral                 Cheick Suhr

## 2017-08-05 NOTE — Anesthesia Preprocedure Evaluation (Addendum)
Anesthesia Evaluation  Patient identified by MRN, date of birth, ID band Patient unresponsive    Reviewed: Allergy & Precautions, NPO status , Patient's Chart, lab work & pertinent test results, Unable to perform ROS - Chart review only  History of Anesthesia Complications Negative for: history of anesthetic complications  Airway Mallampati: Intubated       Dental  (+)    Pulmonary  Left PTX with Rib Fx     breath sounds clear to auscultation   + intubated    Cardiovascular  Rhythm:Regular Rate:Tachycardia     Neuro/Psych  C-spine not cleared    GI/Hepatic Elevated LFTs    Endo/Other    Renal/GU      Musculoskeletal   Abdominal   Peds  Hematology  (+) anemia , Thrombocytopenia S/p splenectomy    Anesthesia Other Findings   Reproductive/Obstetrics                            Anesthesia Physical  Anesthesia Plan  ASA: I  Anesthesia Plan: General   Post-op Pain Management:    Induction: Inhalational  PONV Risk Score and Plan: 2 and Treatment may vary due to age or medical condition  Airway Management Planned: Oral ETT  Additional Equipment: None  Intra-op Plan:   Post-operative Plan: Post-operative intubation/ventilation  Informed Consent:   History available from chart only  Plan Discussed with: CRNA and Anesthesiologist  Anesthesia Plan Comments:         Anesthesia Quick Evaluation
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
## 2017-08-05 NOTE — ED Notes (Signed)
Pt removed c-collar, placed in aspen, stats 86% on 5L, placed on non rebreather

## 2017-08-05 NOTE — Procedures (Signed)
Operative note  Preoperative diagnosis: 2 cm laceration webspace base of right thumb Postoperative diagnosis: Same Procedure: Irrigation and simple closure 2 cm laceration webspace base of right thumb Surgeon: Violeta GelinasBurke Orion Mole, MD Procedure in detail: Emergency consent was obtained.  Timeout procedure was performed.  The laceration was irrigated and then prepped in a sterile fashion.  It was closed in a simple fashion with interrupted 4-0 Prolene's.  A sterile dressing was applied.  He tolerated this well.  Violeta GelinasBurke Gwenneth Whiteman, MD, MPH, FACS Trauma: [phone] General Surgery: [phone]

## 2017-08-05 NOTE — ED Notes (Signed)
Pt transported to OR with Shanda BumpsJessica, RN and Dwain SarnaWakefield, MD.

## 2017-08-05 NOTE — Progress Notes (Signed)
CRITICAL VALUE ALERT  Critical Value:  Lactic 6.7   Date & Time Notied:  08/05/17, 0630
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
## 2017-08-05 NOTE — Anesthesia Procedure Notes (Signed)
Arterial Line Insertion Start/End7/09/2017 3:01 AM, 08/05/2017 3:04 AM Performed by: Val EagleMoser, Shandora Koogler, MD  Patient location: OR. Preanesthetic checklist: patient identified, IV checked, site marked, risks and benefits discussed, surgical consent, monitors and equipment checked, pre-op evaluation, timeout performed and anesthesia consent Lidocaine 1% used for infiltration Left, radial was placed Catheter size: 20 G Hand hygiene performed  and maximum sterile barriers used   Attempts: 1 Procedure performed without using ultrasound guided technique. Following insertion, dressing applied and Biopatch. Post procedure assessment: normal and unchanged  Patient tolerated the procedure well with no immediate complications.

## 2017-08-05 NOTE — Anesthesia Procedure Notes (Signed)
Procedure Name: Intubation Date/Time: 08/05/2017 2:45 AM Performed by: Babs Bertin, CRNA Pre-anesthesia Checklist: Patient identified, Emergency Drugs available, Suction available, Patient being monitored and Timeout performed Patient Re-evaluated:Patient Re-evaluated prior to induction Oxygen Delivery Method: Circle system utilized Preoxygenation: Pre-oxygenation with 100% oxygen Induction Type: IV induction, Rapid sequence and Cricoid Pressure applied Laryngoscope Size: Mac and 4 Grade View: Grade I Tube type: Subglottic suction tube Tube size: 8.0 mm Number of attempts: 1 Airway Equipment and Method: Stylet Placement Confirmation: ETT inserted through vocal cords under direct vision,  positive ETCO2,  CO2 detector and breath sounds checked- equal and bilateral Secured at: 22 cm Tube secured with: Tape Dental Injury: Teeth and Oropharynx as per pre-operative assessment  Comments: Copious amount of blood noted in oropharynx, poor dentition noted

## 2017-08-06 ENCOUNTER — Inpatient Hospital Stay (HOSPITAL_COMMUNITY): Payer: Self-pay | Admitting: Anesthesiology

## 2017-08-06 ENCOUNTER — Encounter (HOSPITAL_COMMUNITY): Payer: Self-pay | Admitting: General Surgery

## 2017-08-06 ENCOUNTER — Encounter (HOSPITAL_COMMUNITY): Admission: EM | Disposition: A | Discharge status: Home / Self Care | Payer: Self-pay | Source: Home / Self Care

## 2017-08-06 ENCOUNTER — Inpatient Hospital Stay (HOSPITAL_COMMUNITY): Payer: Self-pay

## 2017-08-06 HISTORY — PX: LAPAROTOMY: SHX154

## 2017-08-06 LAB — PREPARE PLATELET PHERESIS
UNIT DIVISION: 0
Unit division: 0

## 2017-08-06 LAB — PREPARE FRESH FROZEN PLASMA
Unit division: 0
Unit division: 0
Unit division: 0
Unit division: 0

## 2017-08-06 LAB — BPAM FFP
BLOOD PRODUCT EXPIRATION DATE: 201907132359
BLOOD PRODUCT EXPIRATION DATE: 201907132359
BLOOD PRODUCT EXPIRATION DATE: 201907132359
BLOOD PRODUCT EXPIRATION DATE: 201907142359
BLOOD PRODUCT EXPIRATION DATE: 201907142359
Blood Product Expiration Date: 201907132359
Blood Product Expiration Date: 201907142359
Blood Product Expiration Date: 201907142359
Blood Product Expiration Date: 201907142359
ISSUE DATE / TIME: 201907090235
ISSUE DATE / TIME: 201907090235
ISSUE DATE / TIME: 201907090341
ISSUE DATE / TIME: 201907090341
ISSUE DATE / TIME: 201907090235
ISSUE DATE / TIME: 201907090235
ISSUE DATE / TIME: 201907090241
ISSUE DATE / TIME: 201907090341
ISSUE DATE / TIME: 201907090342
UNIT TYPE AND RH: 1700
UNIT TYPE AND RH: 6200
UNIT TYPE AND RH: 6200
UNIT TYPE AND RH: 6200
Unit Type and Rh: 1700
Unit Type and Rh: 6200
Unit Type and Rh: 7300
Unit Type and Rh: 7300
Unit Type and Rh: 7300

## 2017-08-06 LAB — CBC
HCT: 31.9 % — ABNORMAL LOW (ref 39.0–52.0)
HEMATOCRIT: 35.8 % — AB (ref 39.0–52.0)
HEMOGLOBIN: 11 g/dL — AB (ref 13.0–17.0)
Hemoglobin: 12.4 g/dL — ABNORMAL LOW (ref 13.0–17.0)
MCH: 30.8 pg (ref 26.0–34.0)
MCH: 30.9 pg (ref 26.0–34.0)
MCHC: 34.6 g/dL (ref 30.0–36.0)
MCHC: 34.5 g/dL (ref 30.0–36.0)
MCV: 88.8 fL (ref 78.0–100.0)
MCV: 89.6 fL (ref 78.0–100.0)
Platelets: 102 10*3/uL — ABNORMAL LOW (ref 150–400)
Platelets: 92 10*3/uL — ABNORMAL LOW (ref 150–400)
RBC: 4.03 MIL/uL — ABNORMAL LOW (ref 4.22–5.81)
RBC: 3.56 MIL/uL — ABNORMAL LOW (ref 4.22–5.81)
RDW: 15.3 % (ref 11.5–15.5)
RDW: 15.3 % (ref 11.5–15.5)
WBC: 11.8 10*3/uL — AB (ref 4.0–10.5)
WBC: 12.9 10*3/uL — ABNORMAL HIGH (ref 4.0–10.5)

## 2017-08-06 LAB — BASIC METABOLIC PANEL
ANION GAP: 9 (ref 5–15)
BUN: 14 mg/dL (ref 6–20)
CALCIUM: 7 mg/dL — AB (ref 8.9–10.3)
CHLORIDE: 111 mmol/L (ref 98–111)
CO2: 20 mmol/L — AB (ref 22–32)
Creatinine, Ser: 1.45 mg/dL — ABNORMAL HIGH (ref 0.61–1.24)
GFR calc non Af Amer: >60 mL/min (ref >60)
Glucose, Bld: 116 mg/dL — ABNORMAL HIGH (ref 70–99)
Potassium: 4.3 mmol/L (ref 3.5–5.1)
Sodium: 140 mmol/L (ref 135–145)

## 2017-08-06 LAB — BPAM PLATELET PHERESIS
BLOOD PRODUCT EXPIRATION DATE: 201907102359
BLOOD PRODUCT EXPIRATION DATE: 201907102359
ISSUE DATE / TIME: 201907091622
ISSUE DATE / TIME: 201907090547
Unit Type and Rh: 7300
Unit Type and Rh: 9500

## 2017-08-06 LAB — PREPARE RBC (CROSSMATCH)

## 2017-08-06 SURGERY — LAPAROTOMY, EXPLORATORY
Anesthesia: General

## 2017-08-06 MED ORDER — 0.9 % SODIUM CHLORIDE (POUR BTL) OPTIME
TOPICAL | Status: DC | PRN
  Administered 2017-08-06: 1000 mL

## 2017-08-06 MED ORDER — MIDAZOLAM HCL 2 MG/2ML IJ SOLN
INTRAMUSCULAR | Status: DC | PRN
  Administered 2017-08-06: 2 mg via INTRAVENOUS

## 2017-08-06 MED ORDER — FAMOTIDINE IN NACL 20-0.9 MG/50ML-% IV SOLN
20.0000 mg | Freq: Two times a day (BID) | INTRAVENOUS | Status: DC
  Administered 2017-08-06 – 2017-08-17 (×24): 20 mg via INTRAVENOUS
  Filled 2017-08-06 (×24): qty 50

## 2017-08-06 MED ORDER — ACETAMINOPHEN 160 MG/5ML PO SOLN: 650.0000 mg | Freq: Four times a day (QID) | ORAL | Status: DC | PRN

## 2017-08-06 MED ORDER — SODIUM CHLORIDE 0.9 % IV SOLN
0.0000 ug/min | INTRAVENOUS | Status: DC
  Administered 2017-08-06: 80 ug/min via INTRAVENOUS
  Administered 2017-08-07 (×2): 20 ug/min via INTRAVENOUS
  Administered 2017-08-08: 60 ug/min via INTRAVENOUS
  Administered 2017-08-08: 40 ug/min via INTRAVENOUS
  Administered 2017-08-08 (×2): 60 ug/min via INTRAVENOUS
  Administered 2017-08-08: 50 ug/min via INTRAVENOUS
  Administered 2017-08-08: 60 ug/min via INTRAVENOUS
  Administered 2017-08-09: 20 ug/min via INTRAVENOUS
  Filled 2017-08-06 (×3): qty 1
  Filled 2017-08-06: qty 10
  Filled 2017-08-06 (×2): qty 1
  Filled 2017-08-06 (×4): qty 10
  Filled 2017-08-06: qty 1
  Filled 2017-08-06 (×3): qty 10

## 2017-08-06 MED ORDER — EPHEDRINE SULFATE 50 MG/ML IJ SOLN
INTRAMUSCULAR | Status: AC
  Filled 2017-08-06: qty 1

## 2017-08-06 MED ORDER — MIDAZOLAM HCL 2 MG/2ML IJ SOLN
INTRAMUSCULAR | Status: AC
  Filled 2017-08-06: qty 2

## 2017-08-06 MED ORDER — LIDOCAINE 2% (20 MG/ML) 5 ML SYRINGE
INTRAMUSCULAR | Status: AC
  Filled 2017-08-06: qty 5

## 2017-08-06 MED ORDER — PROPOFOL 10 MG/ML IV BOLUS
INTRAVENOUS | Status: AC
  Filled 2017-08-06: qty 40

## 2017-08-06 MED ORDER — FENTANYL CITRATE (PF) 100 MCG/2ML IJ SOLN
INTRAMUSCULAR | Status: DC | PRN
  Administered 2017-08-06: 100 ug via INTRAVENOUS
  Administered 2017-08-06: 50 ug via INTRAVENOUS
  Administered 2017-08-06: 100 ug via INTRAVENOUS

## 2017-08-06 MED ORDER — LACTATED RINGERS IV SOLN
INTRAVENOUS | Status: DC | PRN
  Administered 2017-08-06: 09:00:00 via INTRAVENOUS

## 2017-08-06 MED ORDER — ALBUMIN HUMAN 5 % IV SOLN
INTRAVENOUS | Status: DC | PRN
  Administered 2017-08-06 (×2): via INTRAVENOUS

## 2017-08-06 MED ORDER — FENTANYL CITRATE (PF) 250 MCG/5ML IJ SOLN
INTRAMUSCULAR | Status: AC
  Filled 2017-08-06: qty 5

## 2017-08-06 MED ORDER — DEXMEDETOMIDINE HCL IN NACL 400 MCG/100ML IV SOLN
0.4000 ug/kg/h | INTRAVENOUS | Status: DC
  Administered 2017-08-06: 0.4 ug/kg/h via INTRAVENOUS
  Administered 2017-08-06: 0.5 ug/kg/h via INTRAVENOUS
  Administered 2017-08-07 (×3): 1.2 ug/kg/h via INTRAVENOUS
  Administered 2017-08-07: 1 ug/kg/h via INTRAVENOUS
  Administered 2017-08-07: 1.2 ug/kg/h via INTRAVENOUS
  Administered 2017-08-08 (×4): 1.6 ug/kg/h via INTRAVENOUS
  Administered 2017-08-08: 0.4 ug/kg/h via INTRAVENOUS
  Administered 2017-08-08: 1.6 ug/kg/h via INTRAVENOUS
  Administered 2017-08-09: 2 ug/kg/h via INTRAVENOUS
  Administered 2017-08-09: 1.7 ug/kg/h via INTRAVENOUS
  Administered 2017-08-09 (×4): 2 ug/kg/h via INTRAVENOUS
  Administered 2017-08-09: 1.6 ug/kg/h via INTRAVENOUS
  Administered 2017-08-09 – 2017-08-10 (×4): 2 ug/kg/h via INTRAVENOUS
  Administered 2017-08-10: 1.8 ug/kg/h via INTRAVENOUS
  Administered 2017-08-10 (×3): 2 ug/kg/h via INTRAVENOUS
  Administered 2017-08-10: 1.8 ug/kg/h via INTRAVENOUS
  Administered 2017-08-10 – 2017-08-20 (×84): 2 ug/kg/h via INTRAVENOUS
  Administered 2017-08-20 (×2): 1.7 ug/kg/h via INTRAVENOUS
  Administered 2017-08-20: 2 ug/kg/h via INTRAVENOUS
  Administered 2017-08-20: 1.8 ug/kg/h via INTRAVENOUS
  Administered 2017-08-20 (×2): 2 ug/kg/h via INTRAVENOUS
  Administered 2017-08-20: 1.7 ug/kg/h via INTRAVENOUS
  Administered 2017-08-20: 2 ug/kg/h via INTRAVENOUS
  Administered 2017-08-21: 0.9 ug/kg/h via INTRAVENOUS
  Administered 2017-08-21: 1.7 ug/kg/h via INTRAVENOUS
  Administered 2017-08-21: 1.5 ug/kg/h via INTRAVENOUS
  Administered 2017-08-21: 1.7 ug/kg/h via INTRAVENOUS
  Administered 2017-08-21: 1.5 ug/kg/h via INTRAVENOUS
  Administered 2017-08-22: 0.7 ug/kg/h via INTRAVENOUS
  Filled 2017-08-06: qty 200
  Filled 2017-08-06 (×18): qty 100
  Filled 2017-08-06: qty 200
  Filled 2017-08-06 (×8): qty 100
  Filled 2017-08-06: qty 200
  Filled 2017-08-06 (×2): qty 100
  Filled 2017-08-06: qty 200
  Filled 2017-08-06 (×9): qty 100
  Filled 2017-08-06: qty 200
  Filled 2017-08-06 (×3): qty 100
  Filled 2017-08-06: qty 200
  Filled 2017-08-06 (×5): qty 100
  Filled 2017-08-06: qty 200
  Filled 2017-08-06 (×2): qty 100
  Filled 2017-08-06: qty 200
  Filled 2017-08-06 (×6): qty 100
  Filled 2017-08-06: qty 200
  Filled 2017-08-06 (×6): qty 100
  Filled 2017-08-06: qty 200
  Filled 2017-08-06 (×4): qty 100
  Filled 2017-08-06: qty 200
  Filled 2017-08-06 (×4): qty 100
  Filled 2017-08-06: qty 200
  Filled 2017-08-06 (×20): qty 100
  Filled 2017-08-06: qty 200
  Filled 2017-08-06 (×11): qty 100

## 2017-08-06 MED ORDER — SUCCINYLCHOLINE CHLORIDE 200 MG/10ML IV SOSY
PREFILLED_SYRINGE | INTRAVENOUS | Status: AC
  Filled 2017-08-06: qty 10

## 2017-08-06 MED ORDER — CEFAZOLIN SODIUM-DEXTROSE 1-4 GM/50ML-% IV SOLN
INTRAVENOUS | Status: DC | PRN
  Administered 2017-08-06: 2 g via INTRAVENOUS

## 2017-08-06 MED ORDER — ROCURONIUM BROMIDE 10 MG/ML (PF) SYRINGE
PREFILLED_SYRINGE | INTRAVENOUS | Status: AC
  Filled 2017-08-06: qty 10

## 2017-08-06 MED ORDER — ALBUMIN HUMAN 5 % IV SOLN
25.0000 g | Freq: Once | INTRAVENOUS | Status: AC
  Administered 2017-08-06: 25 g via INTRAVENOUS
  Filled 2017-08-06: qty 500

## 2017-08-06 MED ORDER — ROCURONIUM BROMIDE 10 MG/ML (PF) SYRINGE
PREFILLED_SYRINGE | INTRAVENOUS | Status: DC | PRN
  Administered 2017-08-06: 80 mg via INTRAVENOUS

## 2017-08-06 SURGICAL SUPPLY — 34 items
BIOPATCH WHT 1IN DISK W/4.0 H (GAUZE/BANDAGES/DRESSINGS) ×2 IMPLANT
CANISTER SUCT 3000ML PPV (MISCELLANEOUS) ×5 IMPLANT
CHLORAPREP W/TINT 26ML (MISCELLANEOUS) ×3 IMPLANT
COVER SURGICAL LIGHT HANDLE (MISCELLANEOUS) ×3 IMPLANT
DRAIN CHANNEL 19F RND (DRAIN) ×2 IMPLANT
DRAPE LAPAROSCOPIC ABDOMINAL (DRAPES) ×3 IMPLANT
DRAPE UTILITY XL STRL (DRAPES) ×6 IMPLANT
DRAPE WARM FLUID 44X44 (DRAPE) ×3 IMPLANT
DRSG OPSITE POSTOP 4X10 (GAUZE/BANDAGES/DRESSINGS) ×2 IMPLANT
DRSG OPSITE POSTOP 4X8 (GAUZE/BANDAGES/DRESSINGS) ×2 IMPLANT
DRSG TEGADERM 4X4.75 (GAUZE/BANDAGES/DRESSINGS) ×2 IMPLANT
ELECT CAUTERY BLADE 6.4 (BLADE) ×3 IMPLANT
ELECT REM PT RETURN 9FT ADLT (ELECTROSURGICAL) ×3
ELECTRODE REM PT RTRN 9FT ADLT (ELECTROSURGICAL) ×1 IMPLANT
EVACUATOR SILICONE 100CC (DRAIN) ×2 IMPLANT
GLOVE BIO SURGEON STRL SZ8 (GLOVE) ×3 IMPLANT
GLOVE BIOGEL PI IND STRL 8 (GLOVE) ×1 IMPLANT
GLOVE BIOGEL PI INDICATOR 8 (GLOVE) ×2
GOWN STRL REUS W/ TWL LRG LVL3 (GOWN DISPOSABLE) ×1 IMPLANT
GOWN STRL REUS W/ TWL XL LVL3 (GOWN DISPOSABLE) ×1 IMPLANT
GOWN STRL REUS W/TWL LRG LVL3 (GOWN DISPOSABLE) ×3
GOWN STRL REUS W/TWL XL LVL3 (GOWN DISPOSABLE) ×3
KIT BASIN OR (CUSTOM PROCEDURE TRAY) ×3 IMPLANT
KIT TURNOVER KIT B (KITS) ×3 IMPLANT
NS IRRIG 1000ML POUR BTL (IV SOLUTION) ×8 IMPLANT
PACK GENERAL/GYN (CUSTOM PROCEDURE TRAY) ×3 IMPLANT
PAD ARMBOARD 7.5X6 YLW CONV (MISCELLANEOUS) ×3 IMPLANT
SUCTION POOLE TIP (SUCTIONS) ×3 IMPLANT
SUT PDS AB 1 TP1 96 (SUTURE) ×10 IMPLANT
SUT SILK 3 0 SH CR/8 (SUTURE) ×3 IMPLANT
SUT SILK 3 0 TIES 10X30 (SUTURE) ×3 IMPLANT
TOWEL OR 17X24 6PK STRL BLUE (TOWEL DISPOSABLE) ×3 IMPLANT
TOWEL OR 17X26 10 PK STRL BLUE (TOWEL DISPOSABLE) ×3 IMPLANT
YANKAUER SUCT BULB TIP NO VENT (SUCTIONS) ×2 IMPLANT
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
## 2017-08-06 NOTE — Anesthesia Procedure Notes (Addendum)
Date/Time: 08/06/2017 8:41 AM Performed by: Jodell Ciproato, Jakub Debold A, CRNA Pre-anesthesia Checklist: Patient identified, Emergency Drugs available, Suction available, Patient being monitored and Timeout performed Patient Re-evaluated:Patient Re-evaluated prior to induction Induction Type: Inhalational induction with existing ETT Placement Confirmation: positive ETCO2 Dental Injury: Teeth and Oropharynx as per pre-operative assessment

## 2017-08-06 NOTE — OR Nursing (Signed)
6 retained lap sponges removed from open abdomen from previous surgery by Dr Dwain SarnaWakefield. Verified by intraoperative xray per Dr Janee Mornhompson and radiologist

## 2017-08-06 NOTE — Transfer of Care (Signed)
Immediate Anesthesia Transfer of Care Note  Patient: Zigmund GottronDavid Druck  Procedure(s) Performed: EXPLORATORY LAPAROTOMY, REMOVAL OF PACKS, CLOSURE OF ABDOMEN, REDUCTION OF SMALL BOWEL INTUSSEPTION (N/A )  Patient Location: ICU  Anesthesia Type:General  Level of Consciousness: sedated, unresponsive and Patient remains intubated per anesthesia plan  Airway & Oxygen Therapy: Patient remains intubated per anesthesia plan and Patient placed on Ventilator (see vital sign flow sheet for setting)  Post-op Assessment: Report given to RN and Post -op Vital signs reviewed and stable  Post vital signs: Reviewed and stable  Last Vitals:  Vitals Value Taken Time  BP 125/66 08/06/2017  9:52 AM  Temp    Pulse 94 08/06/2017  9:57 AM  Resp 17 08/06/2017  9:57 AM  SpO2 97 % 08/06/2017  9:57 AM  Vitals shown include unvalidated device data.  Last Pain:  Vitals:   08/06/17 0745  TempSrc: Oral         Complications: No apparent anesthesia complications

## 2017-08-06 NOTE — OR Nursing (Signed)
DR PATEL FROM RADIOLOGY CONFIRMS NO LAP SPONGES SEEN IN ABDOMEN   @0930 

## 2017-08-06 NOTE — Progress Notes (Signed)
Transported to OR on vent with OR team without complications.

## 2017-08-07 ENCOUNTER — Inpatient Hospital Stay (HOSPITAL_COMMUNITY): Payer: Self-pay

## 2017-08-07 ENCOUNTER — Encounter (HOSPITAL_COMMUNITY): Payer: Self-pay

## 2017-08-07 ENCOUNTER — Inpatient Hospital Stay (HOSPITAL_COMMUNITY): Payer: Self-pay | Admitting: Certified Registered"

## 2017-08-07 LAB — BLOOD GAS, ARTERIAL
ACID-BASE DEFICIT: 5.6 mmol/L — AB (ref 0.0–2.0)
Acid-base deficit: 4 mmol/L — ABNORMAL HIGH (ref 0.0–2.0)
Bicarbonate: 23.9 mmol/L (ref 20.0–28.0)
Bicarbonate: 20.9 mmol/L (ref 20.0–28.0)
Drawn by: 414221
FIO2: 1
FIO2: 1
MECHVT: 640 mL
O2 SAT: 80.4 %
O2 Saturation: 85.4 %
PATIENT TEMPERATURE: 98.8
PCO2 ART: 73.6 mmHg — AB (ref 32.0–48.0)
PCO2 ART: 53.5 mmHg — AB (ref 32.0–48.0)
PEEP: 8 cmH2O
PH ART: 7.217 — AB (ref 7.350–7.450)
PO2 ART: 52.3 mmHg — AB (ref 83.0–108.0)
PO2 ART: 52 mmHg — AB (ref 83.0–108.0)
Patient temperature: 98.6
RATE: 22 resp/min
pH, Arterial: 7.139 — CL (ref 7.350–7.450)

## 2017-08-07 LAB — BASIC METABOLIC PANEL
Anion gap: 5 (ref 5–15)
BUN: 9 mg/dL (ref 6–20)
CALCIUM: 7.6 mg/dL — AB (ref 8.9–10.3)
CO2: 22 mmol/L (ref 22–32)
CREATININE: 1.29 mg/dL — AB (ref 0.61–1.24)
Chloride: 117 mmol/L — ABNORMAL HIGH (ref 98–111)
GFR calc Af Amer: >60 mL/min (ref >60)
GLUCOSE: 112 mg/dL — AB (ref 70–99)
Potassium: 3.8 mmol/L (ref 3.5–5.1)
SODIUM: 144 mmol/L (ref 135–145)

## 2017-08-07 LAB — CBC
HCT: 31.1 % — ABNORMAL LOW (ref 39.0–52.0)
Hemoglobin: 10.5 g/dL — ABNORMAL LOW (ref 13.0–17.0)
MCH: 30.8 pg (ref 26.0–34.0)
MCHC: 33.8 g/dL (ref 30.0–36.0)
MCV: 91.2 fL (ref 78.0–100.0)
PLATELETS: 97 10*3/uL — AB (ref 150–400)
RBC: 3.41 MIL/uL — ABNORMAL LOW (ref 4.22–5.81)
RDW: 15.1 % (ref 11.5–15.5)
WBC: 14.5 10*3/uL — ABNORMAL HIGH (ref 4.0–10.5)

## 2017-08-07 LAB — TRIGLYCERIDES: Triglycerides: 183 mg/dL — ABNORMAL HIGH (ref <150)

## 2017-08-07 MED ORDER — LORAZEPAM 2 MG/ML IJ SOLN: 1.0000 mg | Freq: Four times a day (QID) | INTRAMUSCULAR | Status: AC | PRN

## 2017-08-07 MED ORDER — PROPOFOL 1000 MG/100ML IV EMUL
0.0000 ug/kg/min | INTRAVENOUS | Status: DC
  Administered 2017-08-07: 20 ug/kg/min via INTRAVENOUS
  Administered 2017-08-08: 40 ug/kg/min via INTRAVENOUS
  Administered 2017-08-08: 50 ug/kg/min via INTRAVENOUS
  Administered 2017-08-08: 35 ug/kg/min via INTRAVENOUS
  Administered 2017-08-08 (×3): 50 ug/kg/min via INTRAVENOUS
  Administered 2017-08-09 (×2): 75 ug/kg/min via INTRAVENOUS
  Administered 2017-08-09: 50 ug/kg/min via INTRAVENOUS
  Administered 2017-08-09 (×2): 75 ug/kg/min via INTRAVENOUS
  Administered 2017-08-09: 50 ug/kg/min via INTRAVENOUS
  Administered 2017-08-09 – 2017-08-10 (×7): 75 ug/kg/min via INTRAVENOUS
  Filled 2017-08-07 (×21): qty 100

## 2017-08-07 MED ORDER — SUCCINYLCHOLINE CHLORIDE 20 MG/ML IJ SOLN
INTRAMUSCULAR | Status: DC | PRN
  Administered 2017-08-07: 140 mg via INTRAVENOUS

## 2017-08-07 MED ORDER — FENTANYL CITRATE (PF) 100 MCG/2ML IJ SOLN
50.0000 ug | INTRAMUSCULAR | Status: DC | PRN
  Administered 2017-08-07 – 2017-08-09 (×12): 100 ug via INTRAVENOUS
  Filled 2017-08-07 (×12): qty 2

## 2017-08-07 MED ORDER — METOPROLOL TARTRATE 5 MG/5ML IV SOLN: 5.0000 mg | Freq: Four times a day (QID) | INTRAVENOUS | Status: DC | PRN

## 2017-08-07 MED ORDER — LORAZEPAM 2 MG/ML IJ SOLN
0.0000 mg | Freq: Four times a day (QID) | INTRAMUSCULAR | Status: AC
  Administered 2017-08-07: 2 mg via INTRAVENOUS
  Filled 2017-08-07: qty 1

## 2017-08-07 MED ORDER — ACETAMINOPHEN 325 MG PO TABS
650.0000 mg | ORAL_TABLET | Freq: Four times a day (QID) | ORAL | Status: DC | PRN
  Administered 2017-08-07 – 2017-08-14 (×6): 650 mg via ORAL
  Filled 2017-08-07 (×6): qty 2

## 2017-08-07 MED ORDER — ADULT MULTIVITAMIN W/MINERALS CH
1.0000 | ORAL_TABLET | Freq: Every day | ORAL | Status: DC
  Administered 2017-08-08 – 2017-08-11 (×4): 1 via ORAL
  Filled 2017-08-07 (×4): qty 1

## 2017-08-07 MED ORDER — VITAMIN B-1 100 MG PO TABS
100.0000 mg | ORAL_TABLET | Freq: Every day | ORAL | Status: DC
  Administered 2017-08-08 – 2017-08-11 (×3): 100 mg via ORAL
  Filled 2017-08-07 (×3): qty 1

## 2017-08-07 MED ORDER — LORAZEPAM 2 MG/ML IJ SOLN: 0.0000 mg | Freq: Two times a day (BID) | INTRAMUSCULAR | Status: AC

## 2017-08-07 MED ORDER — PROPOFOL 10 MG/ML IV BOLUS
INTRAVENOUS | Status: DC | PRN
  Administered 2017-08-07: 100 mg via INTRAVENOUS

## 2017-08-07 MED ORDER — LORAZEPAM 2 MG/ML IJ SOLN
INTRAMUSCULAR | Status: AC
  Filled 2017-08-07: qty 1

## 2017-08-07 MED ORDER — THIAMINE HCL 100 MG/ML IJ SOLN
100.0000 mg | Freq: Every day | INTRAMUSCULAR | Status: DC
  Administered 2017-08-07 – 2017-08-09 (×2): 100 mg via INTRAVENOUS
  Filled 2017-08-07 (×2): qty 2

## 2017-08-07 MED ORDER — LORAZEPAM 1 MG PO TABS: 1.0000 mg | ORAL_TABLET | Freq: Four times a day (QID) | ORAL | Status: AC | PRN

## 2017-08-07 MED ORDER — FOLIC ACID 1 MG PO TABS
1.0000 mg | ORAL_TABLET | Freq: Every day | ORAL | Status: DC
  Administered 2017-08-08 – 2017-08-11 (×4): 1 mg via ORAL
  Filled 2017-08-07 (×4): qty 1

## 2017-08-07 NOTE — Progress Notes (Addendum)
Patient ID: Jeffrey Haney, male   DOB: February 05, 1985, 32 y.o.   MRN: [id number] Follow up - Trauma Critical Care  Patient Details:    Jeffrey Haney is an 32 y.o. male.  Lines/tubes : Airway 8 mm (Active)  Secured at (cm) 27 cm 08/07/2017  7:15 AM  Measured From Lips 08/07/2017  7:15 AM  Secured Location Center 08/07/2017  7:15 AM  Secured By [employer] 08/07/2017  7:15 AM  Tube Holder Repositioned Yes 08/07/2017  7:15 AM  Cuff Pressure (cm H2O) 24 cm H2O 08/06/2017  7:27 PM  Site Condition Dry 08/06/2017  8:00 PM     Arterial Line 08/05/17 Radial (Active)  Site Assessment Clean;Dry;Intact 08/06/2017 10:00 PM  Line Status Pulsatile blood flow 08/06/2017 10:00 PM  Art Line Waveform Appropriate 08/06/2017 10:00 PM  Art Line Interventions Zeroed and calibrated;Leveled;Connections checked and tightened;Line pulled back 08/06/2017 10:00 PM  Color/Movement/Sensation Capillary refill less than 3 sec 08/06/2017 10:00 PM  Dressing Type Transparent;Occlusive 08/06/2017 10:00 PM  Dressing Status Clean;Dry;Intact 08/06/2017 10:00 PM  Dressing Change Due 08/12/17 08/06/2017 10:00 PM     Chest Tube 1 Left;Lateral 28 Fr. (Active)  Suction -20 cm H2O 08/06/2017  8:00 PM  Chest Tube Air Leak None 08/06/2017  8:00 PM  Patency Intervention Milked 08/06/2017  8:00 PM  Drainage Description Bright red 08/06/2017  8:00 PM  Dressing Status Clean;Dry;Intact 08/06/2017  8:00 PM  Dressing Intervention Dressing changed 08/06/2017  8:00 PM  Site Assessment Intact 08/06/2017  8:00 PM  Surrounding Skin Intact 08/06/2017  8:00 PM  Output (mL) 100 mL 08/07/2017  2:00 AM     Closed System Drain 2 Lateral;Right RLQ (Active)  Site Description Unable to view;Unremarkable 08/06/2017  8:00 PM  Drainage Appearance Bloody 08/06/2017  8:00 PM  Status To suction (Charged) 08/06/2017  8:00 PM  Output (mL) 30 mL 08/07/2017  2:00 AM     Urethral Catheter Marylene Buerger RN  Latex;Straight-tip 16 Fr. (Active)  Indication for  Insertion or Continuance of Catheter Peri-operative use for selective surgical procedure 08/06/2017  8:00 PM  Site Assessment Clean;Intact 08/06/2017  8:00 PM  Catheter Maintenance Bag below level of bladder;Catheter secured;Drainage bag/tubing not touching floor;Insertion date on drainage bag;No dependent loops;Seal intact 08/06/2017  8:00 PM  Collection Container Standard drainage bag 08/06/2017  8:00 PM  Securement Method Securing device (Describe) 08/06/2017  8:00 PM  Urinary Catheter Interventions Unclamped 08/06/2017  8:00 PM  Output (mL) 170 mL 08/07/2017  7:00 AM    Microbiology/Sepsis markers: Results for orders placed or performed during the hospital encounter of 08/04/17  MRSA PCR Screening     Status: None   Collection Time: 08/05/17  4:44 AM  Result Value Ref Range Status   MRSA by PCR NEGATIVE NEGATIVE Final    Comment:        The GeneXpert MRSA Assay (FDA approved for NASAL specimens only), is one component of a comprehensive MRSA colonization surveillance program. It is not intended to diagnose MRSA infection nor to guide or monitor treatment for MRSA infections. Performed at [facility], [address] [place], [place] [address]     Anti-infectives:  Anti-infectives (From admission, onward)   None      Best Practice/Protocols:  VTE Prophylaxis: Mechanical Continous Sedation  Consults: Treatment Team:  Md, Trauma, MD    Studies:    Events:  Subjective:    Overnight Issues:   Objective:  Vital signs for last 24 hours: Temp:  [98.5 F (36.9  C)-99.8 F (37.7 C)] 99.8 F (37.7 C) (07/11 0400) Pulse Rate:  [84-126] 121 (07/11 0715) Resp:  [18-26] 26 (07/11 0715) BP: (85-131)/(50-74) 123/60 (07/11 0715) SpO2:  [97 %-100 %] 97 % (07/11 0715) Arterial Line BP: (62-131)/(44-64) 123/56 (07/11 0700) FiO2 (%):  [40 %] 40 % (07/11 0715)  Hemodynamic parameters for last 24 hours:    Intake/Output from previous day: 07/10 0701 - 07/11 0700 In:  6439.6 [I.V.:5859.1; IV Piggyback:580.5] Out: 3690 [Urine:3035; Drains:105; Blood:50; Chest Tube:320]  Intake/Output this shift: No intake/output data recorded.  Vent settings for last 24 hours: Vent Mode: PRVC FiO2 (%):  [40 %] 40 % Set Rate:  [18 bmp] 18 bmp Vt Set:  [640 mL] 640 mL PEEP:  [5 cmH20] 5 cmH20 Plateau Pressure:  [23 cmH20-29 cmH20] 29 cmH20  Physical Exam:  General: on vent Neuro: sedated but F/C HEENT/Neck: ETT and collar Resp: clear to auscultation bilaterally and no air leak from CT CVS: RRR GI: soft, quiet, drain SS, dressing dry Extremities: B hand lacs dressed  Results for orders placed or performed during the hospital encounter of 08/04/17 (from the past 24 hour(s))  CBC     Status: Abnormal   Collection Time: 08/06/17  4:19 PM  Result Value Ref Range   WBC 12.9 (H) 4.0 - 10.5 K/uL   RBC 3.56 (L) 4.22 - 5.81 MIL/uL   Hemoglobin 11.0 (L) 13.0 - 17.0 g/dL   HCT 16.1 (L) 09.6 - 04.5 %   MCV 89.6 78.0 - 100.0 fL   MCH 30.9 26.0 - 34.0 pg   MCHC 34.5 30.0 - 36.0 g/dL   RDW 40.9 81.1 - 91.4 %   Platelets 92 (L) 150 - 400 K/uL  CBC     Status: Abnormal   Collection Time: 08/07/17  4:43 AM  Result Value Ref Range   WBC 14.5 (H) 4.0 - 10.5 K/uL   RBC 3.41 (L) 4.22 - 5.81 MIL/uL   Hemoglobin 10.5 (L) 13.0 - 17.0 g/dL   HCT 78.2 (L) 95.6 - 21.3 %   MCV 91.2 78.0 - 100.0 fL   MCH 30.8 26.0 - 34.0 pg   MCHC 33.8 30.0 - 36.0 g/dL   RDW 08.6 57.8 - 46.9 %   Platelets 97 (L) 150 - 400 K/uL  Basic metabolic panel     Status: Abnormal   Collection Time: 08/07/17  4:43 AM  Result Value Ref Range   Sodium 144 135 - 145 mmol/L   Potassium 3.8 3.5 - 5.1 mmol/L   Chloride 117 (H) 98 - 111 mmol/L   CO2 22 22 - 32 mmol/L   Glucose, Bld 112 (H) 70 - 99 mg/dL   BUN 9 6 - 20 mg/dL   Creatinine, Ser 6.29 (H) 0.61 - 1.24 mg/dL   Calcium 7.6 (L) 8.9 - 10.3 mg/dL   GFR calc non Af Amer >60 >60 mL/min   GFR calc Af Amer >60 >60 mL/min   Anion gap 5 5 - 15     Assessment & Plan: Present on Admission: **None**    LOS: 2 days   Additional comments:I reviewed the patient's new clinical lab test results. and CXR MCC L rib FX 3-9 with HPTX, B pulm contusions - L CT to water seal Sternal FX Chest wall abrasions S/P splenectomy, hepatorraphy, L CT, repair L hand lac by Dr. Dwain Sarna 7/9, S/P removal of packs and closure 7/10 by Dr. Janee Morn - JP in place, await bowel function Acute hypoxic vent dependent respiratory failure -  wean to extubate L adrenal hematoma - mild hematuria may be due to unvisualized renal contusion, has improved Acute kidney injury - improving, CRT 1.29 B hand lacerations - S/P closure, plan to remove sutures 7/18 ETOH abuse - Precedex, plan SBIRT once extubated ABL anemia  - Hb 10.5 Thrombocytopenia - PLTs 97 FEN - sips post extubation VTE - no Lovenox until Hb stable and PLTs > 100k Dispo - ICU, OR I spoke with his mother at the bedside. Critical Care Total Time*: 45 Minutes  Violeta GelinasBurke Tamieka Rancourt, MD, MPH, [facility] Trauma: [phone] General Surgery: [phone]  08/07/2017  *Care during the described time interval was provided by me. I have reviewed this patient's available data, including medical history, events of note, physical examination and test results as part of my evaluation.

## 2017-08-07 NOTE — Progress Notes (Signed)
Pt extubated  with this RN and RT at bedside. Pt with strong cough, but unable to clear secretions. Pts o2 sats dropped and NRB placed and MD called to bedside. Pt able to clear and anxiety decreased. Pts o2 sats WNL at this time. Will continue to monitor.

## 2017-08-07 NOTE — Progress Notes (Signed)
Intubated pt with CRNA and RN at bedside. 8.0 tube at 25 lips. Placed pt on previous VT of 620 and a rate of 22 per ABG results prior to intubation. Will collect another ABG  to see if any changes need to be made.

## 2017-08-07 NOTE — Procedures (Signed)
Extubation Procedure Note  Patient Details:   Name: Jeffrey Haney DOB: 05/13/1985 MRN: [id number]   Airway Documentation:    Vent end date: 08/07/17 Vent end time: 0926   Evaluation  O2 sats: transiently fell during during procedure Complications: Complications of anxiety  Patient did tolerate procedure well. Bilateral Breath Sounds: Clear, Diminished   No  PT was extubated to a non re breather  PT was able to clear secretions  PT was not able to talk  RT to monitor     Jeffrey Haney, Jeffrey Haney 08/07/2017, 9:27 AM

## 2017-08-07 NOTE — Procedures (Addendum)
Intubation Procedure Note Trentan Trippe [id number] 1985/02/26  Procedure: Intubation Indications: Respiratory insufficiency  Procedure Details Consent: Unable to obtain consent because of emergent medical necessity. Time Out: Verified patient identification, verified procedure, site/side was marked, verified correct patient position, special equipment/implants available, medications/allergies/relevent history reviewed, required imaging and test results available.  Performed  Maximum sterile technique was used including antiseptics, gloves and hand hygiene.  Mac 3 3  Evaluation Hemodynamic Status: BP stable throughout; O2 sats: transiently fell during during procedure Patient's Current Condition: stable Complications: No apparent complications Patient did tolerate procedure well. Chest X-ray ordered to verify placement.  CXR: pending.  Intubated by CRNA  Myrtis Ser 08/07/2017
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
## 2017-08-07 NOTE — Anesthesia Postprocedure Evaluation (Signed)
Anesthesia Post Note  Patient: Jeffrey Haney  Procedure(s) Performed: EXPLORATORY LAPAROTOMY, REMOVAL OF PACKS, CLOSURE OF ABDOMEN, REDUCTION OF SMALL BOWEL INTUSSEPTION (N/A )     Patient location during evaluation: ICU Anesthesia Type: General Level of consciousness: sedated and patient remains intubated per anesthesia plan Pain management: pain level controlled Vital Signs Assessment: post-procedure vital signs reviewed and stable Respiratory status: patient remains intubated per anesthesia plan Cardiovascular status: stable Postop Assessment: no apparent nausea or vomiting Anesthetic complications: no    Last Vitals:  Vitals:   08/07/17 1715 08/07/17 1730  BP: 122/68 112/61  Pulse: (!) 106 (!) 102  Resp: (!) 34 19  Temp:    SpO2: 95% 96%    Last Pain:  Vitals:   08/07/17 1600  TempSrc: Axillary   Pain Goal:                 Beryle Lathehomas E Milt Coye

## 2017-08-07 NOTE — Transfer of Care (Signed)
OOD Intubation 

## 2017-08-08 ENCOUNTER — Inpatient Hospital Stay (HOSPITAL_COMMUNITY): Payer: Self-pay

## 2017-08-08 ENCOUNTER — Inpatient Hospital Stay: Payer: Self-pay

## 2017-08-08 DIAGNOSIS — J80 Acute respiratory distress syndrome: Secondary | ICD-10-CM

## 2017-08-08 LAB — POCT I-STAT 3, ART BLOOD GAS (G3+)
ACID-BASE DEFICIT: 5 mmol/L — AB (ref 0.0–2.0)
ACID-BASE DEFICIT: 5 mmol/L — AB (ref 0.0–2.0)
ACID-BASE DEFICIT: 5 mmol/L — AB (ref 0.0–2.0)
BICARBONATE: 21.5 mmol/L (ref 20.0–28.0)
Bicarbonate: 22.1 mmol/L (ref 20.0–28.0)
Bicarbonate: 22.2 mmol/L (ref 20.0–28.0)
O2 Saturation: 67 %
O2 Saturation: 72 %
O2 Saturation: 97 %
PCO2 ART: 48.9 mmHg — AB (ref 32.0–48.0)
PH ART: 7.262 — AB (ref 7.350–7.450)
PO2 ART: 43 mmHg — AB (ref 83.0–108.0)
PO2 ART: 115 mmHg — AB (ref 83.0–108.0)
TCO2: 24 mmol/L (ref 22–32)
TCO2: 24 mmol/L (ref 22–32)
TCO2: 23 mmol/L (ref 22–32)
pCO2 arterial: 47.6 mmHg (ref 32.0–48.0)
pCO2 arterial: 47.7 mmHg (ref 32.0–48.0)
pH, Arterial: 7.276 — ABNORMAL LOW (ref 7.350–7.450)
pH, Arterial: 7.266 — ABNORMAL LOW (ref 7.350–7.450)
pO2, Arterial: 40 mmHg — CL (ref 83.0–108.0)

## 2017-08-08 LAB — PHOSPHORUS
PHOSPHORUS: 2.4 mg/dL — AB (ref 2.5–4.6)
Phosphorus: 3.1 mg/dL (ref 2.5–4.6)

## 2017-08-08 LAB — MAGNESIUM
MAGNESIUM: 1.6 mg/dL — AB (ref 1.7–2.4)
Magnesium: 2.1 mg/dL (ref 1.7–2.4)

## 2017-08-08 LAB — BASIC METABOLIC PANEL
Anion gap: 10 (ref 5–15)
BUN: 12 mg/dL (ref 6–20)
CALCIUM: 7.9 mg/dL — AB (ref 8.9–10.3)
CO2: 22 mmol/L (ref 22–32)
Chloride: 110 mmol/L (ref 98–111)
Creatinine, Ser: 1.35 mg/dL — ABNORMAL HIGH (ref 0.61–1.24)
GFR calc Af Amer: >60 mL/min (ref >60)
GLUCOSE: 96 mg/dL (ref 70–99)
Potassium: 4.5 mmol/L (ref 3.5–5.1)
Sodium: 142 mmol/L (ref 135–145)

## 2017-08-08 LAB — GLUCOSE, CAPILLARY
GLUCOSE-CAPILLARY: 76 mg/dL (ref 70–99)
GLUCOSE-CAPILLARY: 89 mg/dL (ref 70–99)
Glucose-Capillary: 115 mg/dL — ABNORMAL HIGH (ref 70–99)
Glucose-Capillary: 136 mg/dL — ABNORMAL HIGH (ref 70–99)
Glucose-Capillary: 84 mg/dL (ref 70–99)

## 2017-08-08 LAB — CBC
HEMATOCRIT: 33.1 % — AB (ref 39.0–52.0)
Hemoglobin: 10.8 g/dL — ABNORMAL LOW (ref 13.0–17.0)
MCH: 30.4 pg (ref 26.0–34.0)
MCHC: 32.6 g/dL (ref 30.0–36.0)
MCV: 93.2 fL (ref 78.0–100.0)
PLATELETS: 88 10*3/uL — AB (ref 150–400)
RBC: 3.55 MIL/uL — ABNORMAL LOW (ref 4.22–5.81)
RDW: 14.6 % (ref 11.5–15.5)
WBC: 8.5 10*3/uL (ref 4.0–10.5)

## 2017-08-08 MED ORDER — PIVOT 1.5 CAL PO LIQD
1000.0000 mL | ORAL | Status: DC
  Administered 2017-08-08 – 2017-08-12 (×5): 1000 mL
  Filled 2017-08-08 (×2): qty 1000

## 2017-08-08 MED ORDER — ALBUMIN HUMAN 5 % IV SOLN
25.0000 g | Freq: Once | INTRAVENOUS | Status: DC
  Filled 2017-08-08: qty 500

## 2017-08-08 MED ORDER — PRO-STAT SUGAR FREE PO LIQD
30.0000 mL | Freq: Two times a day (BID) | ORAL | Status: DC
  Administered 2017-08-08 – 2017-08-12 (×9): 30 mL
  Filled 2017-08-08 (×9): qty 30

## 2017-08-08 MED ORDER — VITAMIN C 500 MG PO TABS
1000.0000 mg | ORAL_TABLET | Freq: Three times a day (TID) | ORAL | Status: AC
  Administered 2017-08-08 – 2017-08-14 (×21): 1000 mg
  Filled 2017-08-08 (×21): qty 2

## 2017-08-08 MED ORDER — PIPERACILLIN-TAZOBACTAM 3.375 G IVPB
3.3750 g | Freq: Three times a day (TID) | INTRAVENOUS | Status: DC
  Administered 2017-08-08 – 2017-08-15 (×22): 3.375 g via INTRAVENOUS
  Filled 2017-08-08 (×23): qty 50

## 2017-08-08 MED ORDER — SELENIUM 200 MCG PO TABS
200.0000 ug | ORAL_TABLET | Freq: Every day | ORAL | Status: AC
  Administered 2017-08-08 – 2017-08-14 (×7): 200 ug
  Filled 2017-08-08 (×7): qty 1

## 2017-08-08 MED ORDER — SODIUM CHLORIDE 0.9% FLUSH: 10.0000 mL | INTRAVENOUS | Status: DC | PRN

## 2017-08-08 MED ORDER — VITAL HIGH PROTEIN PO LIQD
1000.0000 mL | ORAL | Status: DC
  Administered 2017-08-08: 1000 mL

## 2017-08-08 MED ORDER — GUAIFENESIN 100 MG/5ML PO SOLN
5.0000 mL | Freq: Three times a day (TID) | ORAL | Status: DC
  Administered 2017-08-08 – 2017-08-20 (×36): 100 mg
  Filled 2017-08-08 (×36): qty 5

## 2017-08-08 MED ORDER — IPRATROPIUM-ALBUTEROL 0.5-2.5 (3) MG/3ML IN SOLN
3.0000 mL | Freq: Four times a day (QID) | RESPIRATORY_TRACT | Status: DC
  Administered 2017-08-08 – 2017-08-24 (×65): 3 mL via RESPIRATORY_TRACT
  Filled 2017-08-08 (×66): qty 3

## 2017-08-08 MED ORDER — SODIUM CHLORIDE 0.9% FLUSH
10.0000 mL | Freq: Two times a day (BID) | INTRAVENOUS | Status: DC
  Administered 2017-08-08: 30 mL
  Administered 2017-08-08 – 2017-08-09 (×2): 10 mL
  Administered 2017-08-10: 30 mL
  Administered 2017-08-10 – 2017-08-13 (×2): 10 mL
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
## 2017-08-08 NOTE — Progress Notes (Signed)
OT Cancellation Note  Patient Details Name: Zigmund GottronDavid Fyock MRN: [id number] DOB: 04/06/1985   Cancelled Treatment:    Reason Eval/Treat Not Completed: Active bedrest order  [facility] Center[facility]ARY  Katyana Trolinger, OT/L  OT Clinical Specialist [phone]  08/08/2017, 6:55 AM
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
## 2017-08-08 NOTE — Progress Notes (Signed)
CCM made aware of blood gas. No new orders.

## 2017-08-08 NOTE — Progress Notes (Signed)
PT Cancellation Note  Patient Details Name: Jeffrey Haney MRN: [id number] DOB: 04/09/1985   Cancelled Treatment:    Reason Eval/Treat Not Completed: Active bedrest order;Patient not medically ready.  Will see pt 7/13 as able. 08/08/2017  Jeffrey Haney, PT [phone]  (pager)   Jeffrey Haney 08/08/2017, 5:19 PM

## 2017-08-08 NOTE — Progress Notes (Signed)
Peripherally Inserted Central Catheter/Midline Placement  The IV Nurse has discussed with the patient and/or persons authorized to consent for the patient, the purpose of this procedure and the potential benefits and risks involved with this procedure.  The benefits include less needle sticks, lab draws from the catheter, and the patient may be discharged home with the catheter. Risks include, but not limited to, infection, bleeding, blood clot (thrombus formation), and puncture of an artery; nerve damage and irregular heartbeat and possibility to perform a PICC exchange if needed/ordered by physician.  Alternatives to this procedure were also discussed.  Bard Power PICC patient education guide, fact sheet on infection prevention and patient information card has been provided to patient /or left at bedside.    PICC/Midline Placement Documentation     Consent obtained with mother at bedside .   Franne Gripewman, Colette Dicamillo Renee 08/08/2017, 12:40 PM

## 2017-08-08 NOTE — Progress Notes (Signed)
Patient ID: Jeffrey GottronDavid Haney, male   DOB: 07/26/1985, 32 y.o.   MRN: [id number] Transitioned to ARDS net Schedule Duoneb F/U ABG later this evening I will ask CCM to follow vent over the weekend  Patient examined and I agree with the assessment and plan  Jeffrey GelinasBurke Oseph Imburgia, MD, MPH, FACS Trauma: [phone] General Surgery: [phone]  08/08/2017 2:58 PM
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
## 2017-08-09 ENCOUNTER — Encounter (HOSPITAL_COMMUNITY): Payer: Self-pay

## 2017-08-09 ENCOUNTER — Inpatient Hospital Stay (HOSPITAL_COMMUNITY): Payer: Self-pay

## 2017-08-09 DIAGNOSIS — F17200 Nicotine dependence, unspecified, uncomplicated: Secondary | ICD-10-CM

## 2017-08-09 DIAGNOSIS — J95859 Other complication of respirator [ventilator]: Secondary | ICD-10-CM

## 2017-08-09 DIAGNOSIS — I959 Hypotension, unspecified: Secondary | ICD-10-CM

## 2017-08-09 HISTORY — DX: Nicotine dependence, unspecified, uncomplicated: F17.200

## 2017-08-09 LAB — BPAM RBC
BLOOD PRODUCT EXPIRATION DATE: 201907272359
BLOOD PRODUCT EXPIRATION DATE: 201907272359
BLOOD PRODUCT EXPIRATION DATE: 201908012359
BLOOD PRODUCT EXPIRATION DATE: 201908012359
BLOOD PRODUCT EXPIRATION DATE: 201908102359
BLOOD PRODUCT EXPIRATION DATE: 201908112359
BLOOD PRODUCT EXPIRATION DATE: 201908112359
BLOOD PRODUCT EXPIRATION DATE: 201908112359
BLOOD PRODUCT EXPIRATION DATE: 201908112359
Blood Product Expiration Date: 201907252359
Blood Product Expiration Date: 201907262359
Blood Product Expiration Date: 201907262359
Blood Product Expiration Date: 201907262359
Blood Product Expiration Date: 201907282359
Blood Product Expiration Date: 201908012359
Blood Product Expiration Date: 201908102359
Blood Product Expiration Date: 201908102359
Blood Product Expiration Date: 201908112359
ISSUE DATE / TIME: 201907090158
ISSUE DATE / TIME: 201907090158
ISSUE DATE / TIME: 201907090227
ISSUE DATE / TIME: 201907090227
ISSUE DATE / TIME: 201907090227
ISSUE DATE / TIME: 201907090227
ISSUE DATE / TIME: 201907090242
ISSUE DATE / TIME: 201907090242
ISSUE DATE / TIME: 201907090242
ISSUE DATE / TIME: 201907090242
ISSUE DATE / TIME: 201907090308
ISSUE DATE / TIME: 201907090308
ISSUE DATE / TIME: 201907090308
ISSUE DATE / TIME: 201907090308
ISSUE DATE / TIME: 201907090308
ISSUE DATE / TIME: 201907090308
ISSUE DATE / TIME: 201907112355
ISSUE DATE / TIME: 201907120143
UNIT TYPE AND RH: 5100
UNIT TYPE AND RH: 5100
UNIT TYPE AND RH: 5100
UNIT TYPE AND RH: 5100
UNIT TYPE AND RH: 7300
UNIT TYPE AND RH: 7300
UNIT TYPE AND RH: 7300
UNIT TYPE AND RH: 7300
UNIT TYPE AND RH: 7300
UNIT TYPE AND RH: 7300
Unit Type and Rh: 5100
Unit Type and Rh: 5100
Unit Type and Rh: 5100
Unit Type and Rh: 5100
Unit Type and Rh: 7300
Unit Type and Rh: 7300
Unit Type and Rh: 7300
Unit Type and Rh: 7300

## 2017-08-09 LAB — BLOOD CULTURE ID PANEL (REFLEXED)
ACINETOBACTER BAUMANNII: NOT DETECTED
CANDIDA GLABRATA: NOT DETECTED
CANDIDA KRUSEI: NOT DETECTED
Candida albicans: NOT DETECTED
Candida parapsilosis: NOT DETECTED
Candida tropicalis: NOT DETECTED
ENTEROCOCCUS SPECIES: NOT DETECTED
Enterobacter cloacae complex: NOT DETECTED
Enterobacteriaceae species: NOT DETECTED
Escherichia coli: NOT DETECTED
Haemophilus influenzae: NOT DETECTED
Klebsiella oxytoca: NOT DETECTED
Klebsiella pneumoniae: NOT DETECTED
LISTERIA MONOCYTOGENES: NOT DETECTED
Methicillin resistance: NOT DETECTED
NEISSERIA MENINGITIDIS: NOT DETECTED
PROTEUS SPECIES: NOT DETECTED
PSEUDOMONAS AERUGINOSA: NOT DETECTED
STAPHYLOCOCCUS AUREUS BCID: NOT DETECTED
STREPTOCOCCUS AGALACTIAE: NOT DETECTED
STREPTOCOCCUS PNEUMONIAE: NOT DETECTED
STREPTOCOCCUS SPECIES: NOT DETECTED
Serratia marcescens: NOT DETECTED
Staphylococcus species: DETECTED — AB
Streptococcus pyogenes: NOT DETECTED

## 2017-08-09 LAB — POCT I-STAT 3, ART BLOOD GAS (G3+)
ACID-BASE DEFICIT: 5 mmol/L — AB (ref 0.0–2.0)
ACID-BASE DEFICIT: 8 mmol/L — AB (ref 0.0–2.0)
Bicarbonate: 21.4 mmol/L (ref 20.0–28.0)
Bicarbonate: 17.8 mmol/L — ABNORMAL LOW (ref 20.0–28.0)
O2 Saturation: 88 %
O2 Saturation: 91 %
PCO2 ART: 46.7 mmHg (ref 32.0–48.0)
PCO2 ART: 37.9 mmHg (ref 32.0–48.0)
PH ART: 7.272 — AB (ref 7.350–7.450)
PH ART: 7.283 — AB (ref 7.350–7.450)
Patient temperature: 99.5
TCO2: 23 mmol/L (ref 22–32)
TCO2: 19 mmol/L — AB (ref 22–32)
pO2, Arterial: 63 mmHg — ABNORMAL LOW (ref 83.0–108.0)
pO2, Arterial: 71 mmHg — ABNORMAL LOW (ref 83.0–108.0)

## 2017-08-09 LAB — CBC
HEMATOCRIT: 31.8 % — AB (ref 39.0–52.0)
Hemoglobin: 10.2 g/dL — ABNORMAL LOW (ref 13.0–17.0)
MCH: 30.4 pg (ref 26.0–34.0)
MCHC: 32.1 g/dL (ref 30.0–36.0)
MCV: 94.6 fL (ref 78.0–100.0)
Platelets: 107 10*3/uL — ABNORMAL LOW (ref 150–400)
RBC: 3.36 MIL/uL — ABNORMAL LOW (ref 4.22–5.81)
RDW: 15 % (ref 11.5–15.5)
WBC: 7.7 10*3/uL (ref 4.0–10.5)

## 2017-08-09 LAB — TYPE AND SCREEN
ABO/RH(D): B POS
ANTIBODY SCREEN: NEGATIVE
UNIT DIVISION: 0
UNIT DIVISION: 0
UNIT DIVISION: 0
UNIT DIVISION: 0
UNIT DIVISION: 0
UNIT DIVISION: 0
UNIT DIVISION: 0
Unit division: 0
Unit division: 0
Unit division: 0
Unit division: 0
Unit division: 0
Unit division: 0
Unit division: 0
Unit division: 0
Unit division: 0
Unit division: 0
Unit division: 0

## 2017-08-09 LAB — BASIC METABOLIC PANEL
Anion gap: 7 (ref 5–15)
BUN: 15 mg/dL (ref 6–20)
CHLORIDE: 115 mmol/L — AB (ref 98–111)
CO2: 23 mmol/L (ref 22–32)
CREATININE: 1.43 mg/dL — AB (ref 0.61–1.24)
Calcium: 7.9 mg/dL — ABNORMAL LOW (ref 8.9–10.3)
GFR calc Af Amer: >60 mL/min (ref >60)
GFR calc non Af Amer: >60 mL/min (ref >60)
GLUCOSE: 133 mg/dL — AB (ref 70–99)
Potassium: 4.2 mmol/L (ref 3.5–5.1)
Sodium: 145 mmol/L (ref 135–145)

## 2017-08-09 LAB — MAGNESIUM
Magnesium: 2.4 mg/dL (ref 1.7–2.4)
Magnesium: 2.2 mg/dL (ref 1.7–2.4)

## 2017-08-09 LAB — GLUCOSE, CAPILLARY
GLUCOSE-CAPILLARY: 124 mg/dL — AB (ref 70–99)
GLUCOSE-CAPILLARY: 121 mg/dL — AB (ref 70–99)
GLUCOSE-CAPILLARY: 108 mg/dL — AB (ref 70–99)
Glucose-Capillary: 132 mg/dL — ABNORMAL HIGH (ref 70–99)
Glucose-Capillary: 89 mg/dL (ref 70–99)
Glucose-Capillary: 96 mg/dL (ref 70–99)

## 2017-08-09 LAB — URINE CULTURE: Culture: NO GROWTH

## 2017-08-09 LAB — PHOSPHORUS
PHOSPHORUS: 2.7 mg/dL (ref 2.5–4.6)
Phosphorus: 1.9 mg/dL — ABNORMAL LOW (ref 2.5–4.6)

## 2017-08-09 MED ORDER — PHENYLEPHRINE HCL-NACL 10-0.9 MG/250ML-% IV SOLN
0.0000 ug/min | INTRAVENOUS | Status: DC
  Administered 2017-08-09 (×2): 40 ug/min via INTRAVENOUS
  Administered 2017-08-10: 70 ug/min via INTRAVENOUS
  Administered 2017-08-10: 50 ug/min via INTRAVENOUS
  Administered 2017-08-10: 70 ug/min via INTRAVENOUS
  Filled 2017-08-09 (×5): qty 250
  Filled 2017-08-09: qty 10
  Filled 2017-08-09 (×3): qty 250

## 2017-08-09 MED ORDER — PHENYLEPHRINE HCL-NACL 10-0.9 MG/250ML-% IV SOLN
0.0000 ug/min | INTRAVENOUS | Status: DC
  Filled 2017-08-09: qty 250

## 2017-08-09 MED ORDER — FENTANYL 2500MCG IN NS 250ML (10MCG/ML) PREMIX INFUSION
0.0000 ug/h | INTRAVENOUS | Status: DC
  Administered 2017-08-09: 25 ug/h via INTRAVENOUS
  Administered 2017-08-10: 350 ug/h via INTRAVENOUS
  Administered 2017-08-10: 300 ug/h via INTRAVENOUS
  Administered 2017-08-10: 250 ug/h via INTRAVENOUS
  Administered 2017-08-11 (×2): 350 ug/h via INTRAVENOUS
  Administered 2017-08-11 – 2017-08-12 (×2): 400 ug/h via INTRAVENOUS
  Administered 2017-08-12 (×3): 350 ug/h via INTRAVENOUS
  Administered 2017-08-13 – 2017-08-18 (×25): 400 ug/h via INTRAVENOUS
  Administered 2017-08-19: 275 ug/h via INTRAVENOUS
  Administered 2017-08-19: 400 ug/h via INTRAVENOUS
  Administered 2017-08-19: 350 ug/h via INTRAVENOUS
  Administered 2017-08-20: 200 ug/h via INTRAVENOUS
  Filled 2017-08-09 (×40): qty 250

## 2017-08-09 MED ORDER — CLONAZEPAM 0.5 MG PO TBDP
1.0000 mg | ORAL_TABLET | Freq: Two times a day (BID) | ORAL | Status: DC
  Administered 2017-08-09 – 2017-08-11 (×5): 1 mg via ORAL
  Filled 2017-08-09 (×5): qty 2

## 2017-08-09 MED ORDER — FREE WATER
250.0000 mL | Freq: Four times a day (QID) | Status: DC
  Administered 2017-08-09: 150 mL
  Administered 2017-08-09 – 2017-08-11 (×8): 250 mL

## 2017-08-09 NOTE — Progress Notes (Signed)
ETT advanced 2cm per MD order.  Tube is secure at 27 at the lip with good BBS and Vt.  PT suctioned for thick tan mod amount of secretions after tube re-secured.  RT will continue to monitor.
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
## 2017-08-09 NOTE — Progress Notes (Signed)
PT Cancellation Note  Patient Details Name: Jeffrey GottronDavid Haney MRN: [id number] DOB: 01/23/1986   Cancelled Treatment:    Reason Eval/Treat Not Completed: Patient not medically ready   Fabio AsaDevon J Rayanne Padmanabhan 08/09/2017, 12:26 PM
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
## 2017-08-09 NOTE — Progress Notes (Signed)
CCM MD made aware of ABG results. No new orders.

## 2017-08-10 ENCOUNTER — Inpatient Hospital Stay (HOSPITAL_COMMUNITY): Payer: Self-pay

## 2017-08-10 DIAGNOSIS — R579 Shock, unspecified: Secondary | ICD-10-CM

## 2017-08-10 LAB — BLOOD GAS, ARTERIAL
ACID-BASE DEFICIT: 5.2 mmol/L — AB (ref 0.0–2.0)
BICARBONATE: 20.8 mmol/L (ref 20.0–28.0)
Drawn by: 51155
FIO2: 50
LHR: 24 {breaths}/min
O2 Saturation: 87.3 %
PEEP: 10 cmH2O
PH ART: 7.247 — AB (ref 7.350–7.450)
PRESSURE CONTROL: 20 cmH2O
Patient temperature: 99.9
pCO2 arterial: 50 mmHg — ABNORMAL HIGH (ref 32.0–48.0)
pO2, Arterial: 60.7 mmHg — ABNORMAL LOW (ref 83.0–108.0)

## 2017-08-10 LAB — GLUCOSE, CAPILLARY
GLUCOSE-CAPILLARY: 140 mg/dL — AB (ref 70–99)
Glucose-Capillary: 106 mg/dL — ABNORMAL HIGH (ref 70–99)
Glucose-Capillary: 92 mg/dL (ref 70–99)
Glucose-Capillary: 111 mg/dL — ABNORMAL HIGH (ref 70–99)
Glucose-Capillary: 99 mg/dL (ref 70–99)
Glucose-Capillary: 104 mg/dL — ABNORMAL HIGH (ref 70–99)

## 2017-08-10 LAB — POCT I-STAT 3, ART BLOOD GAS (G3+)
Acid-base deficit: 9 mmol/L — ABNORMAL HIGH (ref 0.0–2.0)
Acid-base deficit: 8 mmol/L — ABNORMAL HIGH (ref 0.0–2.0)
Bicarbonate: 18.6 mmol/L — ABNORMAL LOW (ref 20.0–28.0)
Bicarbonate: 18.7 mmol/L — ABNORMAL LOW (ref 20.0–28.0)
O2 Saturation: 80 %
O2 Saturation: 88 %
Patient temperature: 100.6
TCO2: 20 mmol/L — ABNORMAL LOW (ref 22–32)
TCO2: 20 mmol/L — ABNORMAL LOW (ref 22–32)
pCO2 arterial: 50.4 mmHg — ABNORMAL HIGH (ref 32.0–48.0)
pCO2 arterial: 40 mmHg (ref 32.0–48.0)
pH, Arterial: 7.181 — CL (ref 7.350–7.450)
pH, Arterial: 7.278 — ABNORMAL LOW (ref 7.350–7.450)
pO2, Arterial: 59 mmHg — ABNORMAL LOW (ref 83.0–108.0)
pO2, Arterial: 62 mmHg — ABNORMAL LOW (ref 83.0–108.0)

## 2017-08-10 LAB — CULTURE, RESPIRATORY: CULTURE: NORMAL

## 2017-08-10 LAB — CBC
HCT: 32.4 % — ABNORMAL LOW (ref 39.0–52.0)
HEMOGLOBIN: 10.3 g/dL — AB (ref 13.0–17.0)
MCH: 30.3 pg (ref 26.0–34.0)
MCHC: 31.8 g/dL (ref 30.0–36.0)
MCV: 95.3 fL (ref 78.0–100.0)
Platelets: 116 10*3/uL — ABNORMAL LOW (ref 150–400)
RBC: 3.4 MIL/uL — AB (ref 4.22–5.81)
RDW: 15.4 % (ref 11.5–15.5)
WBC: 10.3 10*3/uL (ref 4.0–10.5)

## 2017-08-10 LAB — BASIC METABOLIC PANEL
ANION GAP: 6 (ref 5–15)
BUN: 22 mg/dL — ABNORMAL HIGH (ref 6–20)
CHLORIDE: 116 mmol/L — AB (ref 98–111)
CO2: 22 mmol/L (ref 22–32)
Calcium: 7.8 mg/dL — ABNORMAL LOW (ref 8.9–10.3)
Creatinine, Ser: 1.52 mg/dL — ABNORMAL HIGH (ref 0.61–1.24)
Glucose, Bld: 99 mg/dL (ref 70–99)
POTASSIUM: 4.2 mmol/L (ref 3.5–5.1)
SODIUM: 144 mmol/L (ref 135–145)

## 2017-08-10 LAB — MAGNESIUM: MAGNESIUM: 2.1 mg/dL (ref 1.7–2.4)

## 2017-08-10 LAB — CULTURE, RESPIRATORY W GRAM STAIN

## 2017-08-10 LAB — PHOSPHORUS: PHOSPHORUS: 2.6 mg/dL (ref 2.5–4.6)

## 2017-08-10 MED ORDER — MIDAZOLAM HCL 2 MG/2ML IJ SOLN
1.0000 mg | INTRAMUSCULAR | Status: DC | PRN
  Administered 2017-08-14 – 2017-08-15 (×4): 1 mg via INTRAVENOUS

## 2017-08-10 MED ORDER — FUROSEMIDE 10 MG/ML IJ SOLN
20.0000 mg | Freq: Four times a day (QID) | INTRAMUSCULAR | Status: AC
  Administered 2017-08-10 (×3): 20 mg via INTRAVENOUS
  Filled 2017-08-10 (×3): qty 2

## 2017-08-10 MED ORDER — MIDAZOLAM HCL 50 MG/10ML IJ SOLN
2.0000 mg/h | INTRAMUSCULAR | Status: DC
  Administered 2017-08-10 – 2017-08-12 (×8): 10 mg/h via INTRAVENOUS
  Administered 2017-08-12: 8 mg/h via INTRAVENOUS
  Administered 2017-08-12 – 2017-08-13 (×6): 10 mg/h via INTRAVENOUS
  Filled 2017-08-10 (×16): qty 10

## 2017-08-10 MED ORDER — PHENYLEPHRINE HCL-NACL 40-0.9 MG/250ML-% IV SOLN
0.0000 ug/min | INTRAVENOUS | Status: DC
Start: 2017-08-10 — End: 2017-08-14
  Administered 2017-08-10: 160 ug/min via INTRAVENOUS
  Administered 2017-08-10: 60 ug/min via INTRAVENOUS
  Administered 2017-08-10: 120 ug/min via INTRAVENOUS
  Filled 2017-08-10 (×5): qty 250

## 2017-08-10 NOTE — Progress Notes (Signed)
eLink Physician-Brief Progress Note Patient Name: Jeffrey GottronDavid Haney DOB: 07/10/1985 MRN: [id number]   Date of Service  08/10/2017  HPI/Events of Note  ABG on 50%/PC 20/Rate 24/P 10 = 7.247/50/60.7/20.8.  eICU Interventions  Will order: 1. Increase rate to 30. 2. ABG at 7:15 AM.     Intervention Category Major Interventions: Acid-Base disturbance - evaluation and management;Respiratory failure - evaluation and management  [name] 08/10/2017, 5:10 AM

## 2017-08-10 NOTE — Progress Notes (Signed)
4 Days Post-Op   Subjective/Chief Complaint: No acute changes overnight Vent adjustments per CCM On tube feeds at 45 cc/ hr Chest tube on water seal - 100 cc output   Objective: Vital signs in last 24 hours: Temp:  [98.6 F (37 C)-100.6 F (38.1 C)] 100.6 F (38.1 C) (07/14 0756) Pulse Rate:  [32-112] 110 (07/14 0630) Resp:  [18-42] 19 (07/14 0630) BP: (84-127)/(43-84) 98/53 (07/14 0630) SpO2:  [92 %-100 %] 98 % (07/14 0630) Arterial Line BP: (81-135)/(37-74) 104/43 (07/14 0630) FiO2 (%):  [50 %] 50 % (07/14 0438) Weight:  [85.2 kg (187 lb 13.3 oz)] 85.2 kg (187 lb 13.3 oz) (07/14 0500) Last BM Date: (UTA)  Intake/Output from previous day: 07/13 0701 - 07/14 0700 In: 5533.5 [I.V.:3883.2; NG/GT:1415; IV Piggyback:235.3] Out: 1800 [Urine:1500; Drains:200; Chest Tube:100] Intake/Output this shift: Total I/O In: -  Out: 100 [Urine:100]  General:on vent, sedated Neuro:sedated; no response to stimuli HEENT/Neck:ETT and cervicalcollar Resp:rhonchibilaterally (R>L) L chest tube to water seal; serosanguinous CVS:RRR QM:VHQI, dressing dry stain, few BS, drain serosanguinous output Extremities:no edema, no erythema, pulses WNL B hand lacs minimal drainage    Lab Results:  Recent Labs    08/09/17 0500 08/10/17 0500  WBC 7.7 10.3  HGB 10.2* 10.3*  HCT 31.8* 32.4*  PLT 107* 116*   BMET Recent Labs    08/09/17 0500 08/10/17 0500  NA 145 144  K 4.2 4.2  CL 115* 116*  CO2 23 22  GLUCOSE 133* 99  BUN 15 22*  CREATININE 1.43* 1.52*  CALCIUM 7.9* 7.8*   PT/INR No results for input(s): LABPROT, INR in the last 72 hours. ABG Recent Labs    08/10/17 0445 08/10/17 0757  PHART 7.247* 7.181*  HCO3 20.8 18.6*    Studies/Results: Dg Chest Port 1 View  Result Date: 08/09/2017 CLINICAL DATA:  Followup ARDS. EXAM: PORTABLE CHEST 1 VIEW COMPARISON:  08/07/2017 FINDINGS: ETT tip is above the carina. The enteric tube tip is within the left upper quadrant of  the abdomen with side port below the GE junction. Left-sided chest tube in place. No visible pneumothorax identified. Bilateral pulmonary opacities are again noted. Most advanced within the right upper lobe. These are not significantly changed in the interval. Similar appearance of left lateral rib fractures, acute. IMPRESSION: 1. Stable support apparatus. 2. No change in aeration compared with prior exam. Electronically Signed   By: Signa Kell M.D.   On: 08/09/2017 08:35   [place] Ekg Site Rite  Result Date: 08/08/2017 If Site Rite image not attached, placement could not be confirmed due to current cardiac rhythm.   Anti-infectives: Anti-infectives (From admission, onward)   Start     Dose/Rate Route Frequency Ordered Stop   08/08/17 0815  piperacillin-tazobactam (ZOSYN) IVPB 3.375 g     3.375 g 12.5 mL/hr over 240 Minutes Intravenous Every 8 hours 08/08/17 0807        Assessment/Plan: M[facility] L rib FX 3-9 with HPTX, B pulm contusions- L CT to water seal Sternal FX Chest wall abrasions S/P splenectomy, hepatorraphy, L CT, repair L hand lac by Dr. Dwain Sarna 7/9,  S/P removal of packs and closure 7/10 by Dr. Janee Morn- JP in place Acute hypoxic vent dependent respiratory failure/ARDS- vent per CCM - appreciate assist.  Increasing rate ID- sepsis likely due to PNA associated with pulm contusions. Zosyn empiric, resp CX. Blood CX 1/2 show coag neg staph . Urine CX. Place PICC to get central line out. CV-off vasopressors L adrenal hematoma- mild hematuria  may be due to unvisualized renal contusion, has improved Acute kidney injury- IVF, CRT 1.43 B hand lacerations- S/P closure, plan to remove sutures 7/18 ETOH abuse- Precedex, plan SBIRT once extubated ABL anemia- Hb 10.3 Thrombocytopenia- PLTs 116 FEN- cont TF, place PICC VTE- no Lovenox until PLTs >100k, plts now 107 (7/13) will start lovenox, monitor plt count - stable Dispo- ICU    LOS: 5 days    Wynona LunaMatthew K  Cecelia Graciano 08/10/2017

## 2017-08-10 NOTE — Progress Notes (Signed)
Pt has required increase in neo gtt since bath (from 70-140 mcg), SBP remains 90s breathing over vent (upper 30s), unable to reduce sedation to increase BP. Dr. Molli KnockYacoub ordered to change propofol gtt to versed gtt. While waiting for the versed gtt, sats dropped to 80s. Unable to maintain above 88 without increasing FiO2. Dr. Molli KnockYacoub ordered chest xray to evaluate PTX. RT gave breathing treatment which temporarily helped sats and bp. Will cont to monitor and update MD as needed.

## 2017-08-10 NOTE — Progress Notes (Signed)
eLink Physician-Brief Progress Note Patient Name: Jeffrey GottronDavid Haney DOB: 10/11/1985 MRN: [id number]   Date of Service  08/10/2017  HPI/Events of Note  Agitation - After direct nursing patient care. Patient is already on Fentanyl, Precedex and Versed IV infusion.   eICU Interventions  Will order: 1. Versed 1 mg IV Q 2 hours PRN agitation/sedation.     Intervention Category Minor Interventions: Agitation / anxiety - evaluation and management  Sommer,Steven Eugene 08/10/2017, 9:12 PM

## 2017-08-10 NOTE — Progress Notes (Signed)
Increased RR to 30 per MD order.  Recheck ABG at 7:15am
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
## 2017-08-11 ENCOUNTER — Inpatient Hospital Stay (HOSPITAL_COMMUNITY): Payer: Self-pay

## 2017-08-11 LAB — BLOOD GAS, ARTERIAL
Acid-base deficit: 6.3 mmol/L — ABNORMAL HIGH (ref 0.0–2.0)
Bicarbonate: 19.1 mmol/L — ABNORMAL LOW (ref 20.0–28.0)
DRAWN BY: 252031
FIO2: 70
O2 SAT: 98.2 %
PATIENT TEMPERATURE: 98.6
PCO2 ART: 41 mmHg (ref 32.0–48.0)
PEEP: 10 cmH2O
PH ART: 7.289 — AB (ref 7.350–7.450)
Pressure control: 25 cmH2O
RATE: 35 resp/min
pO2, Arterial: 132 mmHg — ABNORMAL HIGH (ref 83.0–108.0)

## 2017-08-11 LAB — GLUCOSE, CAPILLARY
GLUCOSE-CAPILLARY: 116 mg/dL — AB (ref 70–99)
Glucose-Capillary: 125 mg/dL — ABNORMAL HIGH (ref 70–99)
Glucose-Capillary: 160 mg/dL — ABNORMAL HIGH (ref 70–99)
Glucose-Capillary: 162 mg/dL — ABNORMAL HIGH (ref 70–99)
Glucose-Capillary: 121 mg/dL — ABNORMAL HIGH (ref 70–99)
Glucose-Capillary: 150 mg/dL — ABNORMAL HIGH (ref 70–99)

## 2017-08-11 LAB — BASIC METABOLIC PANEL
ANION GAP: 8 (ref 5–15)
BUN: 33 mg/dL — ABNORMAL HIGH (ref 6–20)
CO2: 21 mmol/L — AB (ref 22–32)
CREATININE: 1.84 mg/dL — AB (ref 0.61–1.24)
Calcium: 7.5 mg/dL — ABNORMAL LOW (ref 8.9–10.3)
Chloride: 118 mmol/L — ABNORMAL HIGH (ref 98–111)
GFR, EST AFRICAN AMERICAN: 55 mL/min — AB (ref >60)
GFR, EST NON AFRICAN AMERICAN: 48 mL/min — AB (ref >60)
GLUCOSE: 133 mg/dL — AB (ref 70–99)
Potassium: 4 mmol/L (ref 3.5–5.1)
Sodium: 147 mmol/L — ABNORMAL HIGH (ref 135–145)

## 2017-08-11 LAB — CULTURE, BLOOD (ROUTINE X 2)
SPECIAL REQUESTS: ADEQUATE
Special Requests: ADEQUATE

## 2017-08-11 LAB — CBC
HEMATOCRIT: 30.8 % — AB (ref 39.0–52.0)
Hemoglobin: 9.9 g/dL — ABNORMAL LOW (ref 13.0–17.0)
MCH: 30.6 pg (ref 26.0–34.0)
MCHC: 32.1 g/dL (ref 30.0–36.0)
MCV: 95.1 fL (ref 78.0–100.0)
PLATELETS: 137 10*3/uL — AB (ref 150–400)
RBC: 3.24 MIL/uL — ABNORMAL LOW (ref 4.22–5.81)
RDW: 15.6 % — ABNORMAL HIGH (ref 11.5–15.5)
WBC: 15.9 10*3/uL — AB (ref 4.0–10.5)

## 2017-08-11 LAB — MAGNESIUM: MAGNESIUM: 2 mg/dL (ref 1.7–2.4)

## 2017-08-11 LAB — PHOSPHORUS: Phosphorus: 3 mg/dL (ref 2.5–4.6)

## 2017-08-11 MED ORDER — ENOXAPARIN SODIUM 40 MG/0.4ML ~~LOC~~ SOLN
40.0000 mg | SUBCUTANEOUS | Status: DC
  Administered 2017-08-11 – 2017-08-24 (×13): 40 mg via SUBCUTANEOUS
  Filled 2017-08-11 (×15): qty 0.4

## 2017-08-11 MED ORDER — FREE WATER
200.0000 mL | Freq: Three times a day (TID) | Status: DC
  Administered 2017-08-11 – 2017-08-12 (×4): 200 mL

## 2017-08-11 MED ORDER — ADULT MULTIVITAMIN W/MINERALS CH
1.0000 | ORAL_TABLET | Freq: Every day | ORAL | Status: DC
  Administered 2017-08-12 – 2017-08-21 (×9): 1
  Filled 2017-08-11 (×9): qty 1

## 2017-08-11 MED ORDER — POLYETHYLENE GLYCOL 3350 17 G PO PACK
17.0000 g | PACK | Freq: Every day | ORAL | Status: DC
  Administered 2017-08-11 – 2017-08-18 (×8): 17 g
  Filled 2017-08-11 (×9): qty 1

## 2017-08-11 MED ORDER — FOLIC ACID 1 MG PO TABS
1.0000 mg | ORAL_TABLET | Freq: Every day | ORAL | Status: DC
  Administered 2017-08-12 – 2017-08-21 (×9): 1 mg
  Filled 2017-08-11 (×10): qty 1

## 2017-08-11 MED ORDER — CLONAZEPAM 0.5 MG PO TBDP
1.0000 mg | ORAL_TABLET | Freq: Two times a day (BID) | ORAL | Status: DC
  Administered 2017-08-11: 1 mg
  Filled 2017-08-11 (×2): qty 2

## 2017-08-11 MED ORDER — THIAMINE HCL 100 MG/ML IJ SOLN
100.0000 mg | Freq: Every day | INTRAMUSCULAR | Status: DC
  Administered 2017-08-20: 100 mg via INTRAVENOUS
  Filled 2017-08-11 (×5): qty 2

## 2017-08-11 MED ORDER — VITAMIN B-1 100 MG PO TABS
100.0000 mg | ORAL_TABLET | Freq: Every day | ORAL | Status: DC
  Administered 2017-08-12 – 2017-08-21 (×9): 100 mg
  Filled 2017-08-11 (×9): qty 1
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
## 2017-08-11 NOTE — Progress Notes (Signed)
PT Cancellation Note  Patient Details Name: Jeffrey GottronDavid Peabody MRN: [id number] DOB: 07/05/1985   Cancelled Treatment:    Reason Eval/Treat Not Completed: Patient not medically ready; intubated and sedated.   Elray McgregorCynthia Johnathin Vanderschaaf 08/11/2017, 9:26 AM  Sheran Lawlessyndi Cyris Maalouf, PT [phone] 08/11/2017

## 2017-08-11 NOTE — Progress Notes (Signed)
OT Cancellation Note  Patient Details Name: Jeffrey GottronDavid Haney MRN: [id number] DOB: 08/20/1985   Cancelled Treatment:    Reason Eval/Treat Not Completed: Patient not medically ready(intubated and RN requesting hold at this time)  Felecia ShellingJones, Davina Howlett B   Kanden Carey, Brynn   OTR/L Pager: [phone] Office: [phone] .  08/11/2017, 8:49 AM

## 2017-08-12 ENCOUNTER — Inpatient Hospital Stay (HOSPITAL_COMMUNITY): Payer: Self-pay

## 2017-08-12 LAB — BASIC METABOLIC PANEL
ANION GAP: 7 (ref 5–15)
BUN: 34 mg/dL — ABNORMAL HIGH (ref 6–20)
CALCIUM: 7.7 mg/dL — AB (ref 8.9–10.3)
CO2: 24 mmol/L (ref 22–32)
Chloride: 118 mmol/L — ABNORMAL HIGH (ref 98–111)
Creatinine, Ser: 1.59 mg/dL — ABNORMAL HIGH (ref 0.61–1.24)
GFR calc Af Amer: >60 mL/min (ref >60)
GFR, EST NON AFRICAN AMERICAN: 57 mL/min — AB (ref >60)
GLUCOSE: 131 mg/dL — AB (ref 70–99)
POTASSIUM: 4.4 mmol/L (ref 3.5–5.1)
SODIUM: 149 mmol/L — AB (ref 135–145)

## 2017-08-12 LAB — CBC
HCT: 29 % — ABNORMAL LOW (ref 39.0–52.0)
Hemoglobin: 9.2 g/dL — ABNORMAL LOW (ref 13.0–17.0)
MCH: 30.7 pg (ref 26.0–34.0)
MCHC: 31.7 g/dL (ref 30.0–36.0)
MCV: 96.7 fL (ref 78.0–100.0)
PLATELETS: 182 10*3/uL (ref 150–400)
RBC: 3 MIL/uL — AB (ref 4.22–5.81)
RDW: 15.9 % — AB (ref 11.5–15.5)
WBC: 17.7 10*3/uL — AB (ref 4.0–10.5)

## 2017-08-12 LAB — GLUCOSE, CAPILLARY
GLUCOSE-CAPILLARY: 128 mg/dL — AB (ref 70–99)
GLUCOSE-CAPILLARY: 113 mg/dL — AB (ref 70–99)
GLUCOSE-CAPILLARY: 135 mg/dL — AB (ref 70–99)
Glucose-Capillary: 136 mg/dL — ABNORMAL HIGH (ref 70–99)
Glucose-Capillary: 163 mg/dL — ABNORMAL HIGH (ref 70–99)
Glucose-Capillary: 117 mg/dL — ABNORMAL HIGH (ref 70–99)

## 2017-08-12 LAB — POCT I-STAT 3, ART BLOOD GAS (G3+)
Acid-base deficit: 4 mmol/L — ABNORMAL HIGH (ref 0.0–2.0)
BICARBONATE: 23 mmol/L (ref 20.0–28.0)
O2 Saturation: 94 %
PCO2 ART: 49.6 mmHg — AB (ref 32.0–48.0)
PO2 ART: 83 mmHg (ref 83.0–108.0)
Patient temperature: 98.9
TCO2: 25 mmol/L (ref 22–32)
pH, Arterial: 7.276 — ABNORMAL LOW (ref 7.350–7.450)

## 2017-08-12 MED ORDER — HALOPERIDOL LACTATE 5 MG/ML IJ SOLN
5.0000 mg | Freq: Four times a day (QID) | INTRAMUSCULAR | Status: DC | PRN
  Administered 2017-08-12 – 2017-08-19 (×4): 5 mg via INTRAVENOUS
  Filled 2017-08-12 (×5): qty 1

## 2017-08-12 MED ORDER — CLONAZEPAM 0.5 MG PO TBDP
2.0000 mg | ORAL_TABLET | Freq: Two times a day (BID) | ORAL | Status: DC
  Administered 2017-08-12 – 2017-08-14 (×6): 2 mg
  Filled 2017-08-12 (×6): qty 4

## 2017-08-12 MED ORDER — OXYCODONE HCL 5 MG/5ML PO SOLN: 10.0000 mg | ORAL | Status: DC | PRN

## 2017-08-12 MED ORDER — FENTANYL BOLUS VIA INFUSION
50.0000 ug | INTRAVENOUS | Status: DC | PRN
  Administered 2017-08-14 – 2017-08-18 (×13): 100 ug via INTRAVENOUS
  Filled 2017-08-12: qty 100

## 2017-08-12 MED ORDER — FREE WATER
300.0000 mL | Freq: Four times a day (QID) | Status: DC
Start: 2017-08-12 — End: 2017-08-13
  Administered 2017-08-12 – 2017-08-13 (×4): 300 mL

## 2017-08-12 MED ORDER — PIVOT 1.5 CAL PO LIQD
1000.0000 mL | ORAL | Status: DC
  Administered 2017-08-13 – 2017-08-19 (×10): 1000 mL
  Filled 2017-08-12 (×2): qty 1000

## 2017-08-12 MED ORDER — QUETIAPINE FUMARATE 100 MG PO TABS
100.0000 mg | ORAL_TABLET | Freq: Two times a day (BID) | ORAL | Status: DC
  Administered 2017-08-12 – 2017-08-14 (×6): 100 mg
  Filled 2017-08-12 (×6): qty 1
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
## 2017-08-12 NOTE — Progress Notes (Signed)
Occupational Therapy Discharge Patient Details Name: Jeffrey GottronDavid Haney MRN: [id number] DOB: 02/24/1985 Today's Date: 08/12/2017 Time:  -     Patient discharged from OT services secondary to pt intubated /sedated at this time. Spoke with trauma Dr Janee Mornhompson and will reorder when appropriate. .  Please see latest therapy progress note for current level of functioning and progress toward goals.    Progress and discharge plan discussed with patient and/or caregiver: Patient unable to participate in discharge planning and no caregivers available  GO     Felecia ShellingJones, Meerab Maselli B   Keisha Amer, Brynn   OTR/L Pager: [phone] Office: [phone] .  08/12/2017, 8:17 AM

## 2017-08-12 NOTE — Progress Notes (Signed)
PT Cancellation Note  Patient Details Name: Jeffrey GottronDavid Haney MRN: [id number] DOB: 08/02/1985   Cancelled Treatment:    Reason Eval/Treat Not Completed: Patient not medically ready. Per OT discussion with trauma MD, therapies to sign off at this time, and will reorder when pt appropriate to participate with PT eval.    Marylynn PearsonLaura D Rivers Gassmann 08/12/2017, 9:30 AM   Conni SlipperLaura Teofil Maniaci, PT, DPT Acute Rehabilitation Services Pager: [phone]
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
## 2017-08-13 ENCOUNTER — Inpatient Hospital Stay (HOSPITAL_COMMUNITY): Payer: Self-pay

## 2017-08-13 LAB — BLOOD GAS, ARTERIAL
Acid-base deficit: 0.5 mmol/L (ref 0.0–2.0)
Bicarbonate: 24.7 mmol/L (ref 20.0–28.0)
DRAWN BY: 51133
FIO2: 40
LHR: 35 {breaths}/min
O2 SAT: 95.4 %
PEEP/CPAP: 8 cmH2O
PH ART: 7.328 — AB (ref 7.350–7.450)
Patient temperature: 98.1
Pressure control: 25 cmH2O
pCO2 arterial: 48.3 mmHg — ABNORMAL HIGH (ref 32.0–48.0)
pO2, Arterial: 79.6 mmHg — ABNORMAL LOW (ref 83.0–108.0)

## 2017-08-13 LAB — CBC
HCT: 27.6 % — ABNORMAL LOW (ref 39.0–52.0)
Hemoglobin: 8.6 g/dL — ABNORMAL LOW (ref 13.0–17.0)
MCH: 30.5 pg (ref 26.0–34.0)
MCHC: 31.2 g/dL (ref 30.0–36.0)
MCV: 97.9 fL (ref 78.0–100.0)
PLATELETS: 233 10*3/uL (ref 150–400)
RBC: 2.82 MIL/uL — AB (ref 4.22–5.81)
RDW: 15.9 % — ABNORMAL HIGH (ref 11.5–15.5)
WBC: 16.9 10*3/uL — ABNORMAL HIGH (ref 4.0–10.5)

## 2017-08-13 LAB — BASIC METABOLIC PANEL
Anion gap: 5 (ref 5–15)
BUN: 31 mg/dL — AB (ref 6–20)
CHLORIDE: 120 mmol/L — AB (ref 98–111)
CO2: 25 mmol/L (ref 22–32)
CREATININE: 1.26 mg/dL — AB (ref 0.61–1.24)
Calcium: 7.8 mg/dL — ABNORMAL LOW (ref 8.9–10.3)
GFR calc Af Amer: >60 mL/min (ref >60)
GFR calc non Af Amer: >60 mL/min (ref >60)
GLUCOSE: 126 mg/dL — AB (ref 70–99)
Potassium: 4 mmol/L (ref 3.5–5.1)
Sodium: 150 mmol/L — ABNORMAL HIGH (ref 135–145)

## 2017-08-13 LAB — GLUCOSE, CAPILLARY
Glucose-Capillary: 143 mg/dL — ABNORMAL HIGH (ref 70–99)
Glucose-Capillary: 129 mg/dL — ABNORMAL HIGH (ref 70–99)
Glucose-Capillary: 111 mg/dL — ABNORMAL HIGH (ref 70–99)
Glucose-Capillary: 121 mg/dL — ABNORMAL HIGH (ref 70–99)
Glucose-Capillary: 119 mg/dL — ABNORMAL HIGH (ref 70–99)
Glucose-Capillary: 119 mg/dL — ABNORMAL HIGH (ref 70–99)

## 2017-08-13 MED ORDER — FREE WATER
300.0000 mL | Status: DC
  Administered 2017-08-13 – 2017-08-20 (×42): 300 mL

## 2017-08-13 NOTE — Progress Notes (Signed)
Patient ID: Jeffrey Haney, male   DOB: 11/22/1985, 32 y.o.   MRN: [id number] Follow up - Trauma Critical Care  Patient Details:    Jeffrey Haney is an 32 y.o. male.  Lines/tubes : Airway (Active)  Secured at (cm) 26 cm 08/13/2017  7:49 AM  Measured From Lips 08/13/2017  7:49 AM  Secured Location Left 08/13/2017  7:49 AM  Secured By [employer] 08/13/2017  7:49 AM  Tube Holder Repositioned Yes 08/13/2017  7:49 AM  Cuff Pressure (cm H2O) 28 cm H2O 08/13/2017  7:49 AM  Site Condition Dry 08/13/2017  7:49 AM     PICC Triple Lumen 08/08/17 PICC Right Brachial 41 cm 0 cm (Active)  Indication for Insertion or Continuance of Line Vasoactive infusions 08/12/2017  8:00 PM  Exposed Catheter (cm) 0 cm 08/08/2017 12:00 PM  Site Assessment Clean;Dry;Intact 08/12/2017  8:00 PM  Lumen #1 Status Infusing 08/12/2017  8:00 PM  Lumen #2 Status Infusing 08/12/2017  8:00 PM  Lumen #3 Status Infusing 08/12/2017  8:00 PM  Dressing Type Transparent;Occlusive 08/12/2017  8:00 PM  Dressing Status Clean;Dry;Intact;Antimicrobial disc in place 08/12/2017  8:00 PM  Line Care Connections checked and tightened 08/12/2017 12:00 PM  Dressing Change Due 08/15/17 08/12/2017  8:00 PM     Arterial Line 08/05/17 Radial (Active)  Site Assessment Clean;Dry;Intact 08/12/2017  8:00 PM  Line Status Pulsatile blood flow 08/12/2017  8:00 PM  Art Line Waveform Appropriate 08/12/2017  8:00 PM  Art Line Interventions Zeroed and calibrated;Connections checked and tightened 08/12/2017  8:00 PM  Color/Movement/Sensation Capillary refill less than 3 sec 08/12/2017  8:00 PM  Dressing Type Transparent;Occlusive 08/12/2017  8:00 PM  Dressing Status Clean;Dry;Intact;Antimicrobial disc in place 08/12/2017  8:00 PM  Interventions Dressing changed;Antimicrobial disc changed 08/12/2017  5:00 PM  Dressing Change Due 08/19/17 08/12/2017  8:00 PM     Closed System Drain 2 Lateral;Right RLQ (Active)  Site Description Unremarkable 08/12/2017  8:00 PM   Dressing Status Clean;Dry;Intact 08/12/2017  8:00 PM  Drainage Appearance Serosanguineous 08/12/2017  8:00 PM  Status To suction (Charged) 08/12/2017  8:00 PM  Intake (mL) 40 ml 08/10/2017  8:00 PM  Output (mL) 10 mL 08/12/2017  6:17 PM     Urethral Catheter Mattadeen-Swaby, Fleet Contras RN  Latex;Straight-tip 16 Fr. (Active)  Indication for Insertion or Continuance of Catheter Other (comment) 08/12/2017  8:00 PM  Site Assessment Clean;Intact;Dry 08/12/2017  8:00 PM  Catheter Maintenance Bag below level of bladder;Catheter secured;Drainage bag/tubing not touching floor;No dependent loops;Seal intact;Insertion date on drainage bag 08/12/2017  8:00 PM  Collection Container Standard drainage bag 08/12/2017  8:00 PM  Securement Method Securing device (Describe) 08/12/2017  8:00 PM  Urinary Catheter Interventions Unclamped 08/12/2017  8:00 PM  Output (mL) 100 mL 08/13/2017  8:00 AM    Microbiology/Sepsis markers: Results for orders placed or performed during the hospital encounter of 08/04/17  MRSA PCR Screening     Status: None   Collection Time: 08/05/17  4:44 AM  Result Value Ref Range Status   MRSA by PCR NEGATIVE NEGATIVE Final    Comment:        The GeneXpert MRSA Assay (FDA approved for NASAL specimens only), is one component of a comprehensive MRSA colonization surveillance program. It is not intended to diagnose MRSA infection nor to guide or monitor treatment for MRSA infections. Performed at [facility], [address] [place], [place] [address]   Culture, respiratory (NON-Expectorated)     Status: None  Collection Time: 08/08/17  8:07 AM  Result Value Ref Range Status   Specimen Description TRACHEAL ASPIRATE  Final   Special Requests NONE  Final   Gram Stain   Final    MODERATE WBC PRESENT, PREDOMINANTLY PMN RARE GRAM POSITIVE COCCI    Culture   Final    Consistent with normal respiratory flora. Performed at [facility], [address] [place], [place] [address]     Report Status 08/10/2017 FINAL  Final  Culture, Urine     Status: None   Collection Time: 08/08/17  8:56 AM  Result Value Ref Range Status   Specimen Description URINE, CATHETERIZED  Final   Special Requests NONE  Final   Culture   Final    NO GROWTH Performed at [facility], [address] [place], [place] [address]    Report Status 08/09/2017 FINAL  Final  Culture, blood (routine x 2)     Status: Abnormal   Collection Time: 08/08/17  9:00 AM  Result Value Ref Range Status   Specimen Description BLOOD RIGHT ANTECUBITAL  Final   Special Requests   Final    BOTTLES DRAWN AEROBIC AND ANAEROBIC Blood Culture adequate volume   Culture  Setup Time   Final    GRAM POSITIVE COCCI IN CLUSTERS ANAEROBIC BOTTLE ONLY CRITICAL RESULT CALLED TO, READ BACK BY AND VERIFIED WITH: B. MANCHERIL, RPHARMD AT 0908 ON 08/09/17 BY C. JESSUP, MLT.    Culture (A)  Final    STAPHYLOCOCCUS EPIDERMIDIS SUSCEPTIBILITIES PERFORMED ON PREVIOUS CULTURE WITHIN THE LAST 5 DAYS. Performed at [facility], [address] [place], [place] [address]    Report Status 08/11/2017 FINAL  Final   Organism ID, Bacteria STAPHYLOCOCCUS EPIDERMIDIS  Final      Susceptibility   Staphylococcus epidermidis - MIC*    CIPROFLOXACIN <=0.5 SENSITIVE Sensitive     ERYTHROMYCIN <=0.25 SENSITIVE Sensitive     GENTAMICIN <=0.5 SENSITIVE Sensitive     OXACILLIN <=0.25 SENSITIVE Sensitive     TETRACYCLINE <=1 SENSITIVE Sensitive     VANCOMYCIN 1 SENSITIVE Sensitive     TRIMETH/SULFA 160 RESISTANT Resistant     CLINDAMYCIN <=0.25 SENSITIVE Sensitive     RIFAMPIN <=0.5 SENSITIVE Sensitive     Inducible Clindamycin NEGATIVE Sensitive     * STAPHYLOCOCCUS EPIDERMIDIS  Culture, blood (routine x 2)     Status: Abnormal   Collection Time: 08/08/17  9:00 AM  Result Value Ref Range Status   Specimen Description BLOOD BLOOD RIGHT FOREARM  Final   Special Requests   Final    BOTTLES DRAWN AEROBIC AND ANAEROBIC Blood  Culture adequate volume   Culture  Setup Time   Final    GRAM POSITIVE COCCI AEROBIC BOTTLE ONLY CRITICAL RESULT CALLED TO, READ BACK BY AND VERIFIED WITH: A. MANCHERIL, AT 0908 ON 08/09/17 BY C. JESSUP, MLT.    Culture (A)  Final    STAPHYLOCOCCUS EPIDERMIDIS SUSCEPTIBILITIES PERFORMED ON PREVIOUS CULTURE WITHIN THE LAST 5 DAYS. Performed at [facility], [address] [place], [place] [address]    Report Status 08/11/2017 FINAL  Final  Blood Culture ID Panel (Reflexed)     Status: Abnormal   Collection Time: 08/08/17  9:00 AM  Result Value Ref Range Status   Enterococcus species NOT DETECTED NOT DETECTED Final   Listeria monocytogenes NOT DETECTED NOT DETECTED Final   Staphylococcus species DETECTED (A) NOT DETECTED Final    Comment: Methicillin (oxacillin) susceptible coagulase  negative staphylococcus. Possible blood culture contaminant (unless isolated from more than one blood culture draw or clinical case suggests pathogenicity). No antibiotic treatment is indicated for blood  culture contaminants. CRITICAL RESULT CALLED TO, READ BACK BY AND VERIFIED WITH: A. MANCHERIL, RPHARMD AT 0908 ON 08/09/17 BY C. JESSUP, MLT.    Staphylococcus aureus NOT DETECTED NOT DETECTED Final   Methicillin resistance NOT DETECTED NOT DETECTED Final   Streptococcus species NOT DETECTED NOT DETECTED Final   Streptococcus agalactiae NOT DETECTED NOT DETECTED Final   Streptococcus pneumoniae NOT DETECTED NOT DETECTED Final   Streptococcus pyogenes NOT DETECTED NOT DETECTED Final   Acinetobacter baumannii NOT DETECTED NOT DETECTED Final   Enterobacteriaceae species NOT DETECTED NOT DETECTED Final   Enterobacter cloacae complex NOT DETECTED NOT DETECTED Final   Escherichia coli NOT DETECTED NOT DETECTED Final   Klebsiella oxytoca NOT DETECTED NOT DETECTED Final   Klebsiella pneumoniae NOT DETECTED NOT DETECTED Final   Proteus species NOT DETECTED NOT DETECTED Final   Serratia marcescens NOT  DETECTED NOT DETECTED Final   Haemophilus influenzae NOT DETECTED NOT DETECTED Final   Neisseria meningitidis NOT DETECTED NOT DETECTED Final   Pseudomonas aeruginosa NOT DETECTED NOT DETECTED Final   Candida albicans NOT DETECTED NOT DETECTED Final   Candida glabrata NOT DETECTED NOT DETECTED Final   Candida krusei NOT DETECTED NOT DETECTED Final   Candida parapsilosis NOT DETECTED NOT DETECTED Final   Candida tropicalis NOT DETECTED NOT DETECTED Final    Comment: Performed at [facility], [address] [place], [place] [address]  Culture, respiratory (NON-Expectorated)     Status: None (Preliminary result)   Collection Time: 08/11/17  8:46 AM  Result Value Ref Range Status   Specimen Description TRACHEAL ASPIRATE  Final   Special Requests Normal  Final   Gram Stain   Final    FEW WBC PRESENT, PREDOMINANTLY PMN NO ORGANISMS SEEN    Culture   Final    CULTURE REINCUBATED FOR BETTER GROWTH Performed at [facility], [address], [place], [place] [address]    Report Status PENDING  Incomplete    Anti-infectives:  Anti-infectives (From admission, onward)   Start     Dose/Rate Route Frequency Ordered Stop   08/08/17 0815  piperacillin-tazobactam (ZOSYN) IVPB 3.375 g     3.375 g 12.5 mL/hr over 240 Minutes Intravenous Every 8 hours 08/08/17 0981        Best Practice/Protocols:  VTE Prophylaxis: Lovenox (prophylaxtic dose) Continous Sedation  Consults: Treatment Team:  Md, Trauma, MD    Studies:    Events:  Subjective:    Overnight Issues:   Objective:  Vital signs for last 24 hours: Temp:  [97.9 F (36.6 C)-101.6 F (38.7 C)] 98.1 F (36.7 C) (07/17 0400) Pulse Rate:  [94-137] 116 (07/17 0749) Resp:  [22-39] 32 (07/17 0749) BP: (100-161)/(51-87) 100/55 (07/17 0749) SpO2:  [96 %-100 %] 100 % (07/17 0749) Arterial Line BP: (96-151)/(41-67) 128/47 (07/17 0600) FiO2 (%):  [40 %] 40 % (07/17 0749) Weight:  [86.1 kg (189 lb 13.1 oz)] 86.1 kg  (189 lb 13.1 oz) (07/17 0400)  Hemodynamic parameters for last 24 hours:    Intake/Output from previous day: 07/16 0701 - 07/17 0700 In: 4140.8 [I.V.:2023.2; NG/GT:1875; IV Piggyback:242.6] Out: 2350 [Urine:2255; Drains:55; Chest Tube:40]  Intake/Output this shift: Total I/O In: -  Out: 100 [Urine:100]  Vent settings for last 24 hours: Vent Mode: PCV FiO2 (%):  [40 %] 40 % Set Rate:  [  35 bmp] 35 bmp PEEP:  [8 cmH20] 8 cmH20 Pressure Support:  [8 cmH20] 8 cmH20 Plateau Pressure:  [21 cmH20-32 cmH20] 31 cmH20  Physical Exam:  General: on vent Neuro: sedated HEENT/Neck: ETT and collr Resp: rhonchi bilaterally CVS: regular rate and rhythm, S1, S2 normal, no murmur, click, rub or gallop GI: incision CDI, JP now SS Extremities: edema 1+  Results for orders placed or performed during the hospital encounter of 08/04/17 (from the past 24 hour(s))  I-STAT 3, arterial blood gas (G3+)     Status: Abnormal   Collection Time: 08/12/17  9:17 AM  Result Value Ref Range   pH, Arterial 7.276 (L) 7.350 - 7.450   pCO2 arterial 49.6 (H) 32.0 - 48.0 mmHg   pO2, Arterial 83.0 83.0 - 108.0 mmHg   Bicarbonate 23.0 20.0 - 28.0 mmol/L   TCO2 25 22 - 32 mmol/L   O2 Saturation 94.0 %   Acid-base deficit 4.0 (H) 0.0 - 2.0 mmol/L   Patient temperature 98.9 F    Collection site ARTERIAL LINE    Drawn by RT    Sample type ARTERIAL   Glucose, capillary     Status: Abnormal   Collection Time: 08/12/17 11:40 AM  Result Value Ref Range   Glucose-Capillary 163 (H) 70 - 99 mg/dL   Comment 1 Notify RN    Comment 2 Document in Chart   Glucose, capillary     Status: Abnormal   Collection Time: 08/12/17  4:01 PM  Result Value Ref Range   Glucose-Capillary 113 (H) 70 - 99 mg/dL   Comment 1 Notify RN    Comment 2 Document in Chart   Glucose, capillary     Status: Abnormal   Collection Time: 08/12/17  7:33 PM  Result Value Ref Range   Glucose-Capillary 135 (H) 70 - 99 mg/dL  Glucose, capillary      Status: Abnormal   Collection Time: 08/12/17 11:17 PM  Result Value Ref Range   Glucose-Capillary 117 (H) 70 - 99 mg/dL  Glucose, capillary     Status: Abnormal   Collection Time: 08/13/17  3:19 AM  Result Value Ref Range   Glucose-Capillary 143 (H) 70 - 99 mg/dL  Blood gas, arterial     Status: Abnormal   Collection Time: 08/13/17  3:48 AM  Result Value Ref Range   FIO2 40.00    Delivery systems VENTILATOR    Mode PRESSURE CONTROL    LHR 35 resp/min   Peep/cpap 8.0 cm H20   Pressure control 25 cm H20   pH, Arterial 7.328 (L) 7.350 - 7.450   pCO2 arterial 48.3 (H) 32.0 - 48.0 mmHg   pO2, Arterial 79.6 (L) 83.0 - 108.0 mmHg   Bicarbonate 24.7 20.0 - 28.0 mmol/L   Acid-base deficit 0.5 0.0 - 2.0 mmol/L   O2 Saturation 95.4 %   Patient temperature 98.1    Collection site A-LINE    Drawn by [id number]    Sample type ARTERIAL DRAW   CBC     Status: Abnormal   Collection Time: 08/13/17  4:29 AM  Result Value Ref Range   WBC 16.9 (H) 4.0 - 10.5 K/uL   RBC 2.82 (L) 4.22 - 5.81 MIL/uL   Hemoglobin 8.6 (L) 13.0 - 17.0 g/dL   HCT 47.8 (L) 29.5 - 62.1 %   MCV 97.9 78.0 - 100.0 fL   MCH 30.5 26.0 - 34.0 pg   MCHC 31.2 30.0 - 36.0 g/dL   RDW 30.8 (H) 65.7 -  15.5 %   Platelets 233 150 - 400 K/uL  Basic metabolic panel     Status: Abnormal   Collection Time: 08/13/17  4:29 AM  Result Value Ref Range   Sodium 150 (H) 135 - 145 mmol/L   Potassium 4.0 3.5 - 5.1 mmol/L   Chloride 120 (H) 98 - 111 mmol/L   CO2 25 22 - 32 mmol/L   Glucose, Bld 126 (H) 70 - 99 mg/dL   BUN 31 (H) 6 - 20 mg/dL   Creatinine, Ser 1.61 (H) 0.61 - 1.24 mg/dL   Calcium 7.8 (L) 8.9 - 10.3 mg/dL   GFR calc non Af Amer >60 >60 mL/min   GFR calc Af Amer >60 >60 mL/min   Anion gap 5 5 - 15  Glucose, capillary     Status: Abnormal   Collection Time: 08/13/17  8:11 AM  Result Value Ref Range   Glucose-Capillary 129 (H) 70 - 99 mg/dL   Comment 1 Notify RN    Comment 2 Document in Chart     Assessment &  Plan: Present on Admission: **None**    LOS: 8 days   Additional comments:I reviewed the patient's new clinical lab test results. and ABG [facility] L rib FX 3-9 with HPTX, B pulm contusions- D/C L CT  Sternal FX Chest wall abrasions S/P splenectomy, hepatorraphy, L CT, repair L hand lac by Dr. Dwain Sarna 7/9,  S/P removal of packs and closure 7/10 by Dr. Janee Morn- JP in place Acute hypoxic vent dependent respiratory failure/ARDS- PCV with 40% and PEEP 8, slow improvement, plan transition to [facility] tomorrow ID- sepsis likely due to PNA associated with pulm contusions. Zosyn d5 empiric, resp CX NL flora. Blood CX 1/2 show coag neg staph . Urine CX neg. Repeat resp CX reincubated CV-off vasopressors L adrenal hematoma- mild hematuria may be due to unvisualized renal contusion, has improved Acute kidney injury- CRT improved a bit today. No lasix, increase free water as below B hand lacerations- S/P closure, plan to remove sutures 7/18 ETOH abuse- Precedex, plan SBIRT once extubated ABL anemia FEN- increase free water for hypernatremia, Miralax. TF VTE- Lovenox Dispo- ICU Critical Care Total Time*: 45 Minutes  Violeta Gelinas, MD, MPH, FACS Trauma: [phone] General Surgery: [phone]  08/13/2017  *Care during the described time interval was provided by me. I have reviewed this patient's available data, including medical history, events of note, physical examination and test results as part of my evaluation.

## 2017-08-14 ENCOUNTER — Inpatient Hospital Stay (HOSPITAL_COMMUNITY): Payer: Self-pay

## 2017-08-14 LAB — BASIC METABOLIC PANEL
Anion gap: 7 (ref 5–15)
BUN: 32 mg/dL — AB (ref 6–20)
CHLORIDE: 116 mmol/L — AB (ref 98–111)
CO2: 25 mmol/L (ref 22–32)
Calcium: 8 mg/dL — ABNORMAL LOW (ref 8.9–10.3)
Creatinine, Ser: 1.18 mg/dL (ref 0.61–1.24)
GFR calc Af Amer: >60 mL/min (ref >60)
GFR calc non Af Amer: >60 mL/min (ref >60)
GLUCOSE: 129 mg/dL — AB (ref 70–99)
POTASSIUM: 3.5 mmol/L (ref 3.5–5.1)
Sodium: 148 mmol/L — ABNORMAL HIGH (ref 135–145)

## 2017-08-14 LAB — GLUCOSE, CAPILLARY
GLUCOSE-CAPILLARY: 125 mg/dL — AB (ref 70–99)
Glucose-Capillary: 117 mg/dL — ABNORMAL HIGH (ref 70–99)
Glucose-Capillary: 117 mg/dL — ABNORMAL HIGH (ref 70–99)
Glucose-Capillary: 119 mg/dL — ABNORMAL HIGH (ref 70–99)
Glucose-Capillary: 113 mg/dL — ABNORMAL HIGH (ref 70–99)
Glucose-Capillary: 107 mg/dL — ABNORMAL HIGH (ref 70–99)

## 2017-08-14 LAB — BLOOD GAS, ARTERIAL
ACID-BASE EXCESS: 2.2 mmol/L — AB (ref 0.0–2.0)
Bicarbonate: 26.1 mmol/L (ref 20.0–28.0)
DRAWN BY: 414221
FIO2: 40
LHR: 35 {breaths}/min
O2 Saturation: 97.9 %
PEEP: 8 cmH2O
PH ART: 7.432 (ref 7.350–7.450)
Patient temperature: 98.6
Pressure control: 25 cmH2O
pCO2 arterial: 39.9 mmHg (ref 32.0–48.0)
pO2, Arterial: 102 mmHg (ref 83.0–108.0)

## 2017-08-14 LAB — CULTURE, RESPIRATORY W GRAM STAIN: Special Requests: NORMAL

## 2017-08-14 LAB — POCT I-STAT 3, ART BLOOD GAS (G3+)
Acid-Base Excess: 1 mmol/L (ref 0.0–2.0)
Bicarbonate: 25.7 mmol/L (ref 20.0–28.0)
O2 Saturation: 95 %
TCO2: 27 mmol/L (ref 22–32)
pCO2 arterial: 43.1 mmHg (ref 32.0–48.0)
pH, Arterial: 7.386 (ref 7.350–7.450)
pO2, Arterial: 78 mmHg — ABNORMAL LOW (ref 83.0–108.0)

## 2017-08-14 LAB — CBC
HCT: 26.9 % — ABNORMAL LOW (ref 39.0–52.0)
HEMOGLOBIN: 8.5 g/dL — AB (ref 13.0–17.0)
MCH: 30.8 pg (ref 26.0–34.0)
MCHC: 31.6 g/dL (ref 30.0–36.0)
MCV: 97.5 fL (ref 78.0–100.0)
Platelets: 284 10*3/uL (ref 150–400)
RBC: 2.76 MIL/uL — AB (ref 4.22–5.81)
RDW: 15.9 % — ABNORMAL HIGH (ref 11.5–15.5)
WBC: 20.6 10*3/uL — ABNORMAL HIGH (ref 4.0–10.5)

## 2017-08-14 MED ORDER — SODIUM CHLORIDE 0.9 % IV SOLN
2.0000 mg/h | INTRAVENOUS | Status: DC
  Administered 2017-08-14 – 2017-08-15 (×3): 10 mg/h via INTRAVENOUS
  Filled 2017-08-14 (×4): qty 20

## 2017-08-14 MED ORDER — SODIUM CHLORIDE 0.9 % IV SOLN
INTRAVENOUS | Status: DC | PRN
  Administered 2017-08-15 – 2017-08-21 (×3): via INTRAVENOUS

## 2017-08-14 MED ORDER — VANCOMYCIN HCL 10 G IV SOLR
1500.0000 mg | Freq: Once | INTRAVENOUS | Status: AC
  Administered 2017-08-14: 1500 mg via INTRAVENOUS
  Filled 2017-08-14: qty 1500

## 2017-08-14 MED ORDER — ACETAMINOPHEN 325 MG PO TABS
650.0000 mg | ORAL_TABLET | Freq: Four times a day (QID) | ORAL | Status: DC | PRN
  Administered 2017-08-15: 650 mg
  Filled 2017-08-14: qty 2

## 2017-08-14 MED ORDER — VANCOMYCIN HCL IN DEXTROSE 1-5 GM/200ML-% IV SOLN
1000.0000 mg | Freq: Three times a day (TID) | INTRAVENOUS | Status: AC
  Administered 2017-08-14 – 2017-08-20 (×19): 1000 mg via INTRAVENOUS
  Filled 2017-08-14 (×19): qty 200

## 2017-08-14 MED ORDER — SODIUM CHLORIDE 0.9 % IV SOLN
INTRAVENOUS | Status: DC | PRN
  Administered 2017-08-16: 04:00:00 via INTRAVENOUS

## 2017-08-14 NOTE — Progress Notes (Signed)
Pharmacy Antibiotic Note  Jeffrey Haney is a 32 y.o. male admitted on 08/04/2017 with pneumonia.  Pharmacy has been consulted for vancomycin dosing.  Plan: Loading Dose: Vancomycin 1500 mg IV x 1 Vancomycin 1000 mg IV every 8 hours.  Goal trough 15-20 mcg/mL.  Monitor renal function, CBC Will obtain vancomycin level when at steady state   Height: 6\' 1"  (185.4 cm) Weight: 193 lb 9 oz (87.8 kg) IBW/kg (Calculated) : 79.9  Temp (24hrs), Avg:99.7 F (37.6 C), Min:98.2 F (36.8 C), Max:101.4 F (38.6 C)  Recent Labs  Lab 08/10/17 0500 08/11/17 0500 08/12/17 0428 08/13/17 0429 08/14/17 0513  WBC 10.3 15.9* 17.7* 16.9* 20.6*  CREATININE 1.52* 1.84* 1.59* 1.26* 1.18    Estimated Creatinine Clearance: 103.4 mL/min (by C-G formula based on SCr of 1.18 mg/dL).    No Known Allergies  Antimicrobials this admission: Zosyn 7/12 >>  Vancomycin 7/18 >>   Microbiology results: 7/15 Resp cx > pseudomonas aeruginosa, MRSA 7/12 BCx>> 2/4 CONS (1/2 in both sets - likely contaminant)  7/12 UCx neg  7/12 TA > normal flora 7/9 MRSA PCR neg   Thank you for allowing pharmacy to be a part of this patient's care.  Otis Peakebecca M Brystol Wasilewski, PharmD PGY1 Pharmacy Resident 08/14/2017 12:56 PM
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
## 2017-08-15 ENCOUNTER — Inpatient Hospital Stay (HOSPITAL_COMMUNITY): Payer: Self-pay

## 2017-08-15 LAB — GLUCOSE, CAPILLARY
GLUCOSE-CAPILLARY: 93 mg/dL (ref 70–99)
GLUCOSE-CAPILLARY: 106 mg/dL — AB (ref 70–99)
GLUCOSE-CAPILLARY: 103 mg/dL — AB (ref 70–99)
Glucose-Capillary: 102 mg/dL — ABNORMAL HIGH (ref 70–99)
Glucose-Capillary: 129 mg/dL — ABNORMAL HIGH (ref 70–99)

## 2017-08-15 LAB — BLOOD GAS, ARTERIAL
Acid-Base Excess: 3.3 mmol/L — ABNORMAL HIGH (ref 0.0–2.0)
Acid-Base Excess: 3.3 mmol/L — ABNORMAL HIGH (ref 0.0–2.0)
BICARBONATE: 27.6 mmol/L (ref 20.0–28.0)
Bicarbonate: 27.6 mmol/L (ref 20.0–28.0)
DRAWN BY: 398991
Drawn by: 39899
FIO2: 40
FIO2: 40
LHR: 24 {breaths}/min
MECHVT: 640 mL
O2 SAT: 98.5 %
O2 Saturation: 98.5 %
PATIENT TEMPERATURE: 98.9
PCO2 ART: 44.7 mmHg (ref 32.0–48.0)
PEEP: 8 cmH2O
PEEP: 8 cmH2O
PH ART: 7.409 (ref 7.350–7.450)
Patient temperature: 98.9
RATE: 24 resp/min
VT: 640 mL
pCO2 arterial: 44.7 mmHg (ref 32.0–48.0)
pH, Arterial: 7.409 (ref 7.350–7.450)
pO2, Arterial: 123 mmHg — ABNORMAL HIGH (ref 83.0–108.0)
pO2, Arterial: 123 mmHg — ABNORMAL HIGH (ref 83.0–108.0)

## 2017-08-15 LAB — CBC WITH DIFFERENTIAL/PLATELET
ABS IMMATURE GRANULOCYTES: 0.4 10*3/uL — AB (ref 0.0–0.1)
BASOS PCT: 0 %
Basophils Absolute: 0.1 10*3/uL (ref 0.0–0.1)
Eosinophils Absolute: 0.9 10*3/uL — ABNORMAL HIGH (ref 0.0–0.7)
Eosinophils Relative: 4 %
HCT: 26.9 % — ABNORMAL LOW (ref 39.0–52.0)
Hemoglobin: 8.4 g/dL — ABNORMAL LOW (ref 13.0–17.0)
IMMATURE GRANULOCYTES: 2 %
LYMPHS PCT: 9 %
Lymphs Abs: 1.8 10*3/uL (ref 0.7–4.0)
MCH: 30.8 pg (ref 26.0–34.0)
MCHC: 31.2 g/dL (ref 30.0–36.0)
MCV: 98.5 fL (ref 78.0–100.0)
MONO ABS: 1.2 10*3/uL — AB (ref 0.1–1.0)
Monocytes Relative: 6 %
NEUTROS ABS: 16.2 10*3/uL — AB (ref 1.7–7.7)
Neutrophils Relative %: 79 %
PLATELETS: 338 10*3/uL (ref 150–400)
RBC: 2.73 MIL/uL — ABNORMAL LOW (ref 4.22–5.81)
RDW: 15.9 % — ABNORMAL HIGH (ref 11.5–15.5)
WBC: 20.7 10*3/uL — AB (ref 4.0–10.5)

## 2017-08-15 LAB — BASIC METABOLIC PANEL
Anion gap: 7 (ref 5–15)
BUN: 25 mg/dL — AB (ref 6–20)
CO2: 27 mmol/L (ref 22–32)
Calcium: 8.1 mg/dL — ABNORMAL LOW (ref 8.9–10.3)
Chloride: 111 mmol/L (ref 98–111)
Creatinine, Ser: 1.04 mg/dL (ref 0.61–1.24)
GFR calc Af Amer: >60 mL/min (ref >60)
Glucose, Bld: 113 mg/dL — ABNORMAL HIGH (ref 70–99)
POTASSIUM: 3.5 mmol/L (ref 3.5–5.1)
SODIUM: 145 mmol/L (ref 135–145)

## 2017-08-15 MED ORDER — POTASSIUM CHLORIDE 20 MEQ/15ML (10%) PO SOLN
20.0000 meq | Freq: Once | ORAL | Status: AC
  Administered 2017-08-15: 20 meq
  Filled 2017-08-15: qty 15

## 2017-08-15 MED ORDER — FUROSEMIDE 10 MG/ML IJ SOLN
40.0000 mg | Freq: Once | INTRAMUSCULAR | Status: AC
  Administered 2017-08-15: 40 mg via INTRAVENOUS
  Filled 2017-08-15: qty 4

## 2017-08-15 MED ORDER — MIDAZOLAM BOLUS VIA INFUSION
1.0000 mg | INTRAVENOUS | Status: DC | PRN
  Administered 2017-08-15 – 2017-08-18 (×6): 1 mg via INTRAVENOUS
  Filled 2017-08-15: qty 1

## 2017-08-15 MED ORDER — QUETIAPINE FUMARATE 200 MG PO TABS
200.0000 mg | ORAL_TABLET | Freq: Three times a day (TID) | ORAL | Status: DC
  Administered 2017-08-15 – 2017-08-17 (×6): 200 mg
  Filled 2017-08-15 (×6): qty 1

## 2017-08-15 MED ORDER — CLONAZEPAM 0.5 MG PO TBDP
2.0000 mg | ORAL_TABLET | Freq: Three times a day (TID) | ORAL | Status: DC
  Administered 2017-08-15 – 2017-08-21 (×16): 2 mg
  Filled 2017-08-15 (×16): qty 4

## 2017-08-15 MED ORDER — VECURONIUM BROMIDE 10 MG IV SOLR
INTRAVENOUS | Status: AC
  Administered 2017-08-15: 10 mg
  Filled 2017-08-15: qty 10

## 2017-08-15 MED ORDER — FLEET ENEMA 7-19 GM/118ML RE ENEM
1.0000 | ENEMA | Freq: Once | RECTAL | Status: AC
  Administered 2017-08-15: 1 via RECTAL

## 2017-08-15 MED ORDER — VECURONIUM BROMIDE 10 MG IV SOLR
10.0000 mg | Freq: Once | INTRAVENOUS | Status: AC
  Administered 2017-08-15: 10 mg via INTRAVENOUS

## 2017-08-15 MED ORDER — SODIUM CHLORIDE 0.9 % IV SOLN
2.0000 mg/h | INTRAVENOUS | Status: DC
  Administered 2017-08-15 – 2017-08-16 (×3): 10 mg/h via INTRAVENOUS
  Administered 2017-08-16: 6 mg/h via INTRAVENOUS
  Administered 2017-08-17 – 2017-08-18 (×4): 10 mg/h via INTRAVENOUS
  Administered 2017-08-19: 8 mg/h via INTRAVENOUS
  Administered 2017-08-19: 10 mg/h via INTRAVENOUS
  Administered 2017-08-20: 5 mg/h via INTRAVENOUS
  Filled 2017-08-15 (×11): qty 20

## 2017-08-15 MED ORDER — ACETAMINOPHEN 325 MG PO TABS
325.0000 mg | ORAL_TABLET | ORAL | Status: AC
  Administered 2017-08-15: 325 mg
  Filled 2017-08-15: qty 1

## 2017-08-15 MED ORDER — ACETAMINOPHEN 160 MG/5ML PO SOLN
650.0000 mg | ORAL | Status: DC | PRN
  Administered 2017-08-15 – 2017-08-20 (×4): 650 mg
  Filled 2017-08-15 (×4): qty 20.3
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
## 2017-08-15 NOTE — Progress Notes (Signed)
RT note: ventilator changes made per MD.  [facility].  Decreased respiratory rate from 30 to 24.  Patient currently tolerating well.  Will obtain a follow up ABG.

## 2017-08-15 NOTE — Progress Notes (Signed)
0100- After bath, noticed cuff leak and patient became agitated; RT called. Switched ETT holder and advanced tube from 25 at the lips to 27 at the lips to re-inflate cuff. Patient calmed down shortly after.   [email or web address]- MD called for fever not well treated by the q6 PRN tylenol order. Verbal order to place patient on cooling blanket. Called MD back, no cooling blankets available per portable equipment. New verbal order to change Tylenol to q4 PRN and give a now dose of 325mg . Ice packs also placed on patient.

## 2017-08-15 NOTE — Progress Notes (Signed)
Follow up - Trauma Critical Care  Patient Details:    Jeffrey Haney is an 32 y.o. male.  Lines/tubes : Airway (Active)  Secured at (cm) 27 cm 08/15/2017  3:39 AM  Measured From Lips 08/15/2017  3:39 AM  Secured Location Right 08/15/2017  3:39 AM  Secured By [employer] 08/15/2017  3:39 AM  Tube Holder Repositioned Yes 08/15/2017  3:39 AM  Cuff Pressure (cm H2O) 26 cm H2O 08/13/2017 11:24 PM  Site Condition Dry 08/15/2017  3:39 AM     PICC Triple Lumen 08/08/17 PICC Right Brachial 41 cm 0 cm (Active)  Indication for Insertion or Continuance of Line Prolonged intravenous therapies 08/14/2017  8:00 PM  Exposed Catheter (cm) 0 cm 08/08/2017 12:00 PM  Site Assessment Clean;Dry;Intact 08/14/2017  8:00 PM  Lumen #1 Status Infusing;Blood return noted;Flushed 08/14/2017  8:00 PM  Lumen #2 Status Infusing;Blood return noted;Flushed 08/14/2017  8:00 PM  Lumen #3 Status Infusing;Blood return noted;Flushed 08/14/2017  8:00 PM  Dressing Type Transparent;Occlusive 08/14/2017  8:00 PM  Dressing Status Clean;Dry;Intact;Antimicrobial disc in place 08/14/2017  8:00 PM  Line Care Connections checked and tightened 08/14/2017  8:00 PM  Dressing Change Due 08/15/17 08/14/2017  8:00 PM     Closed System Drain 2 Lateral;Right RLQ (Active)  Site Description Unremarkable 08/14/2017  8:00 PM  Dressing Status Clean;Dry;Intact 08/14/2017  8:00 PM  Drainage Appearance Serosanguineous 08/14/2017  8:00 PM  Status To suction (Charged) 08/14/2017  8:00 PM  Intake (mL) 40 ml 08/10/2017  8:00 PM  Output (mL) 40 mL 08/15/2017  6:00 AM     Urethral Catheter Marylene Buerger RN  Latex;Straight-tip 16 Fr. (Active)  Indication for Insertion or Continuance of Catheter Acute urinary retention 08/14/2017  8:00 PM  Site Assessment Clean;Intact;Dry 08/14/2017  8:00 PM  Catheter Maintenance Bag below level of bladder;Catheter secured;Drainage bag/tubing not touching floor;No dependent loops;Seal intact 08/14/2017  8:00 PM   Collection Container Standard drainage bag 08/14/2017  8:00 PM  Securement Method Securing device (Describe) 08/14/2017  8:00 PM  Urinary Catheter Interventions Unclamped 08/14/2017  4:00 PM  Output (mL) 275 mL 08/15/2017  6:00 AM    Microbiology/Sepsis markers: Results for orders placed or performed during the hospital encounter of 08/04/17  MRSA PCR Screening     Status: None   Collection Time: 08/05/17  4:44 AM  Result Value Ref Range Status   MRSA by PCR NEGATIVE NEGATIVE Final    Comment:        The GeneXpert MRSA Assay (FDA approved for NASAL specimens only), is one component of a comprehensive MRSA colonization surveillance program. It is not intended to diagnose MRSA infection nor to guide or monitor treatment for MRSA infections. Performed at [facility], [address] [place], [place] [address]   Culture, respiratory (NON-Expectorated)     Status: None   Collection Time: 08/08/17  8:07 AM  Result Value Ref Range Status   Specimen Description TRACHEAL ASPIRATE  Final   Special Requests NONE  Final   Gram Stain   Final    MODERATE WBC PRESENT, PREDOMINANTLY PMN RARE GRAM POSITIVE COCCI    Culture   Final    Consistent with normal respiratory flora. Performed at [facility], [address] [place], [place] [address]    Report Status 08/10/2017 FINAL  Final  Culture, Urine     Status: None   Collection Time: 08/08/17  8:56 AM  Result Value Ref Range Status   Specimen Description URINE,  CATHETERIZED  Final   Special Requests NONE  Final   Culture   Final    NO GROWTH Performed at [facility], [address], [place], [place] [address]    Report Status 08/09/2017 FINAL  Final  Culture, blood (routine x 2)     Status: Abnormal   Collection Time: 08/08/17  9:00 AM  Result Value Ref Range Status   Specimen Description BLOOD RIGHT ANTECUBITAL  Final   Special Requests   Final    BOTTLES DRAWN AEROBIC AND ANAEROBIC Blood Culture adequate  volume   Culture  Setup Time   Final    GRAM POSITIVE COCCI IN CLUSTERS ANAEROBIC BOTTLE ONLY CRITICAL RESULT CALLED TO, READ BACK BY AND VERIFIED WITH: B. MANCHERIL, RPHARMD AT 0908 ON 08/09/17 BY C. JESSUP, MLT.    Culture (A)  Final    STAPHYLOCOCCUS EPIDERMIDIS SUSCEPTIBILITIES PERFORMED ON PREVIOUS CULTURE WITHIN THE LAST 5 DAYS. Performed at [facility], [address] [place], [place] [address]    Report Status 08/11/2017 FINAL  Final   Organism ID, Bacteria STAPHYLOCOCCUS EPIDERMIDIS  Final      Susceptibility   Staphylococcus epidermidis - MIC*    CIPROFLOXACIN <=0.5 SENSITIVE Sensitive     ERYTHROMYCIN <=0.25 SENSITIVE Sensitive     GENTAMICIN <=0.5 SENSITIVE Sensitive     OXACILLIN <=0.25 SENSITIVE Sensitive     TETRACYCLINE <=1 SENSITIVE Sensitive     VANCOMYCIN 1 SENSITIVE Sensitive     TRIMETH/SULFA 160 RESISTANT Resistant     CLINDAMYCIN <=0.25 SENSITIVE Sensitive     RIFAMPIN <=0.5 SENSITIVE Sensitive     Inducible Clindamycin NEGATIVE Sensitive     * STAPHYLOCOCCUS EPIDERMIDIS  Culture, blood (routine x 2)     Status: Abnormal   Collection Time: 08/08/17  9:00 AM  Result Value Ref Range Status   Specimen Description BLOOD BLOOD RIGHT FOREARM  Final   Special Requests   Final    BOTTLES DRAWN AEROBIC AND ANAEROBIC Blood Culture adequate volume   Culture  Setup Time   Final    GRAM POSITIVE COCCI AEROBIC BOTTLE ONLY CRITICAL RESULT CALLED TO, READ BACK BY AND VERIFIED WITH: A. MANCHERIL, AT 0908 ON 08/09/17 BY C. JESSUP, MLT.    Culture (A)  Final    STAPHYLOCOCCUS EPIDERMIDIS SUSCEPTIBILITIES PERFORMED ON PREVIOUS CULTURE WITHIN THE LAST 5 DAYS. Performed at [facility], [address] [place], [place] [address]    Report Status 08/11/2017 FINAL  Final  Blood Culture ID Panel (Reflexed)     Status: Abnormal   Collection Time: 08/08/17  9:00 AM  Result Value Ref Range Status   Enterococcus species NOT DETECTED NOT DETECTED Final   Listeria  monocytogenes NOT DETECTED NOT DETECTED Final   Staphylococcus species DETECTED (A) NOT DETECTED Final    Comment: Methicillin (oxacillin) susceptible coagulase negative staphylococcus. Possible blood culture contaminant (unless isolated from more than one blood culture draw or clinical case suggests pathogenicity). No antibiotic treatment is indicated for blood  culture contaminants. CRITICAL RESULT CALLED TO, READ BACK BY AND VERIFIED WITH: A. MANCHERIL, RPHARMD AT 0908 ON 08/09/17 BY C. JESSUP, MLT.    Staphylococcus aureus NOT DETECTED NOT DETECTED Final   Methicillin resistance NOT DETECTED NOT DETECTED Final   Streptococcus species NOT DETECTED NOT DETECTED Final   Streptococcus agalactiae NOT DETECTED NOT DETECTED Final   Streptococcus pneumoniae NOT DETECTED NOT DETECTED Final   Streptococcus pyogenes NOT DETECTED NOT DETECTED Final   Acinetobacter baumannii NOT  DETECTED NOT DETECTED Final   Enterobacteriaceae species NOT DETECTED NOT DETECTED Final   Enterobacter cloacae complex NOT DETECTED NOT DETECTED Final   Escherichia coli NOT DETECTED NOT DETECTED Final   Klebsiella oxytoca NOT DETECTED NOT DETECTED Final   Klebsiella pneumoniae NOT DETECTED NOT DETECTED Final   Proteus species NOT DETECTED NOT DETECTED Final   Serratia marcescens NOT DETECTED NOT DETECTED Final   Haemophilus influenzae NOT DETECTED NOT DETECTED Final   Neisseria meningitidis NOT DETECTED NOT DETECTED Final   Pseudomonas aeruginosa NOT DETECTED NOT DETECTED Final   Candida albicans NOT DETECTED NOT DETECTED Final   Candida glabrata NOT DETECTED NOT DETECTED Final   Candida krusei NOT DETECTED NOT DETECTED Final   Candida parapsilosis NOT DETECTED NOT DETECTED Final   Candida tropicalis NOT DETECTED NOT DETECTED Final    Comment: Performed at [facility], [address] [place], [place] [address]  Culture, respiratory (NON-Expectorated)     Status: None   Collection Time: 08/11/17  8:46 AM   Result Value Ref Range Status   Specimen Description TRACHEAL ASPIRATE  Final   Special Requests Normal  Final   Gram Stain   Final    FEW WBC PRESENT, PREDOMINANTLY PMN NO ORGANISMS SEEN Performed at [facility], [address] [place], [place] [address]    Culture   Final    FEW PSEUDOMONAS AERUGINOSA FEW METHICILLIN RESISTANT STAPHYLOCOCCUS AUREUS    Report Status 08/14/2017 FINAL  Final   Organism ID, Bacteria PSEUDOMONAS AERUGINOSA  Final   Organism ID, Bacteria METHICILLIN RESISTANT STAPHYLOCOCCUS AUREUS  Final      Susceptibility   Methicillin resistant staphylococcus aureus - MIC*    CIPROFLOXACIN <=0.5 SENSITIVE Sensitive     ERYTHROMYCIN <=0.25 SENSITIVE Sensitive     GENTAMICIN <=0.5 SENSITIVE Sensitive     OXACILLIN RESISTANT Resistant     TETRACYCLINE <=1 SENSITIVE Sensitive     VANCOMYCIN <=0.5 SENSITIVE Sensitive     TRIMETH/SULFA <=10 SENSITIVE Sensitive     CLINDAMYCIN <=0.25 SENSITIVE Sensitive     RIFAMPIN <=0.5 SENSITIVE Sensitive     Inducible Clindamycin NEGATIVE Sensitive     * FEW METHICILLIN RESISTANT STAPHYLOCOCCUS AUREUS   Pseudomonas aeruginosa - MIC*    CEFTAZIDIME 4 SENSITIVE Sensitive     CIPROFLOXACIN <=0.25 SENSITIVE Sensitive     GENTAMICIN <=1 SENSITIVE Sensitive     IMIPENEM 2 SENSITIVE Sensitive     PIP/TAZO 8 SENSITIVE Sensitive     CEFEPIME 2 SENSITIVE Sensitive     * FEW PSEUDOMONAS AERUGINOSA    Anti-infectives:  Anti-infectives (From admission, onward)   Start     Dose/Rate Route Frequency Ordered Stop   08/14/17 2200  vancomycin (VANCOCIN) IVPB 1000 mg/200 mL premix     1,000 mg 200 mL/hr over 60 Minutes Intravenous Every 8 hours 08/14/17 1313     08/14/17 1400  vancomycin (VANCOCIN) 1,500 mg in sodium chloride 0.9 % 500 mL IVPB     1,500 mg 250 mL/hr over 120 Minutes Intravenous  Once 08/14/17 1313 08/14/17 1608   08/08/17 0815  piperacillin-tazobactam (ZOSYN) IVPB 3.375 g     3.375 g 12.5 mL/hr over 240 Minutes  Intravenous Every 8 hours 08/08/17 [id number]        Best Practice/Protocols:  VTE Prophylaxis: Lovenox (prophylaxtic dose) Continous Sedation  Consults: Treatment Team:  Md, Trauma, MD    Studies:    Events:  Subjective:    Overnight Issues:   Objective:  Vital signs for  last 24 hours: Temp:  [98.9 F (37.2 C)-101.2 F (38.4 C)] 100.4 F (38 C) (07/19 0400) Pulse Rate:  [42-134] 94 (07/19 0700) Resp:  [26-42] 30 (07/19 0700) BP: (106-182)/(56-86) 138/75 (07/19 0700) SpO2:  [96 %-100 %] 98 % (07/19 0700) Arterial Line BP: (90-168)/(45-77) 138/53 (07/18 1730) FiO2 (%):  [40 %] 40 % (07/19 0400) Weight:  [89.9 kg (198 lb 3.1 oz)] 89.9 kg (198 lb 3.1 oz) (07/19 0500)  Hemodynamic parameters for last 24 hours:    Intake/Output from previous day: 07/18 0701 - 07/19 0700 In: 6833.7 [I.V.:2317.4; NG/GT:3360; IV Piggyback:1156.3] Out: 3810 [Urine:3745; Drains:65]  Intake/Output this shift: No intake/output data recorded.  Vent settings for last 24 hours: Vent Mode: PCV FiO2 (%):  [40 %] 40 % Set Rate:  [30 bmp-35 bmp] 30 bmp PEEP:  [8 cmH20] 8 cmH20 Plateau Pressure:  [28 cmH20-32 cmH20] 30 cmH20  Physical Exam:  General: on vent Neuro: sedated HEENT/Neck: ETT and collar Resp: some rhonchi CVS: RRR GI: wound clean and soft Extremities: edema 1+  Results for orders placed or performed during the hospital encounter of 08/04/17 (from the past 24 hour(s))  Glucose, capillary     Status: Abnormal   Collection Time: 08/14/17 11:17 AM  Result Value Ref Range   Glucose-Capillary 119 (H) 70 - 99 mg/dL  I-STAT 3, arterial blood gas (G3+)     Status: Abnormal   Collection Time: 08/14/17  2:01 PM  Result Value Ref Range   pH, Arterial 7.386 7.350 - 7.450   pCO2 arterial 43.1 32.0 - 48.0 mmHg   pO2, Arterial 78.0 (L) 83.0 - 108.0 mmHg   Bicarbonate 25.7 20.0 - 28.0 mmol/L   TCO2 27 22 - 32 mmol/L   O2 Saturation 95.0 %   Acid-Base Excess 1.0 0.0 - 2.0 mmol/L    Patient temperature 99.5 F    Collection site ARTERIAL LINE    Drawn by Nurse    Sample type ARTERIAL   Glucose, capillary     Status: Abnormal   Collection Time: 08/14/17  3:35 PM  Result Value Ref Range   Glucose-Capillary 113 (H) 70 - 99 mg/dL  Glucose, capillary     Status: Abnormal   Collection Time: 08/14/17  7:42 PM  Result Value Ref Range   Glucose-Capillary 107 (H) 70 - 99 mg/dL  Glucose, capillary     Status: Abnormal   Collection Time: 08/14/17 11:22 PM  Result Value Ref Range   Glucose-Capillary 125 (H) 70 - 99 mg/dL  Glucose, capillary     Status: None   Collection Time: 08/15/17  3:32 AM  Result Value Ref Range   Glucose-Capillary 93 70 - 99 mg/dL  CBC with Differential/Platelet     Status: Abnormal   Collection Time: 08/15/17  4:35 AM  Result Value Ref Range   WBC 20.7 (H) 4.0 - 10.5 K/uL   RBC 2.73 (L) 4.22 - 5.81 MIL/uL   Hemoglobin 8.4 (L) 13.0 - 17.0 g/dL   HCT 10.2 (L) 72.5 - 36.6 %   MCV 98.5 78.0 - 100.0 fL   MCH 30.8 26.0 - 34.0 pg   MCHC 31.2 30.0 - 36.0 g/dL   RDW 44.0 (H) 34.7 - 42.5 %   Platelets 338 150 - 400 K/uL   Neutrophils Relative % 79 %   Neutro Abs 16.2 (H) 1.7 - 7.7 K/uL   Lymphocytes Relative 9 %   Lymphs Abs 1.8 0.7 - 4.0 K/uL   Monocytes Relative 6 %  Monocytes Absolute 1.2 (H) 0.1 - 1.0 K/uL   Eosinophils Relative 4 %   Eosinophils Absolute 0.9 (H) 0.0 - 0.7 K/uL   Basophils Relative 0 %   Basophils Absolute 0.1 0.0 - 0.1 K/uL   Immature Granulocytes 2 %   Abs Immature Granulocytes 0.4 (H) 0.0 - 0.1 K/uL  Basic metabolic panel     Status: Abnormal   Collection Time: 08/15/17  4:35 AM  Result Value Ref Range   Sodium 145 135 - 145 mmol/L   Potassium 3.5 3.5 - 5.1 mmol/L   Chloride 111 98 - 111 mmol/L   CO2 27 22 - 32 mmol/L   Glucose, Bld 113 (H) 70 - 99 mg/dL   BUN 25 (H) 6 - 20 mg/dL   Creatinine, Ser 0.98 0.61 - 1.24 mg/dL   Calcium 8.1 (L) 8.9 - 10.3 mg/dL   GFR calc non Af Amer >60 >60 mL/min   GFR calc Af Amer >60  >60 mL/min   Anion gap 7 5 - 15  Glucose, capillary     Status: Abnormal   Collection Time: 08/15/17  7:45 AM  Result Value Ref Range   Glucose-Capillary 106 (H) 70 - 99 mg/dL   Comment 1 Notify RN    Comment 2 Call MD NNP PA CNM     Assessment & Plan: Present on Admission: **None**    LOS: 10 days   Additional comments:I reviewed the patient's new clinical lab test results. 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 Dwain Sarna 7/9,  S/P removal of packs and closure 7/10 by Dr. Janee Morn- JP in place Acute hypoxic vent dependent respiratory failure/ARDS- transition to PRVC, adjust MV, ABG at 1200 ID- Infected ARDS, Pseud/MRSA. Has completed 7d Zosyn so will stop this. Vanc d2/7 L adrenal hematoma Acute kidney injury- resolved B hand lacerations- S/P closure, plan to remove sutures 7/18 ETOH abuse- Precedex, plan SBIRT once extubated ABL anemia FEN- free water for hypernatremia, Miralax. TF, Increase Klon/sero to wean drips VTE- Lovenox Dispo- ICU Critical Care Total Time*: 59 Minutes  Violeta Gelinas, MD, MPH, FACS Trauma: [phone] General Surgery: [phone]  08/15/2017  *Care during the described time interval was provided by me. I have reviewed this patient's available data, including medical history, events of note, physical examination and test results as part of my evaluation.  Patient ID: Jeffrey Haney, male   DOB: [birth year], 32 y.o.   MRN: [id number]

## 2017-08-16 ENCOUNTER — Inpatient Hospital Stay (HOSPITAL_COMMUNITY): Payer: Self-pay

## 2017-08-16 LAB — GLUCOSE, CAPILLARY
GLUCOSE-CAPILLARY: 119 mg/dL — AB (ref 70–99)
GLUCOSE-CAPILLARY: 107 mg/dL — AB (ref 70–99)
GLUCOSE-CAPILLARY: 130 mg/dL — AB (ref 70–99)
Glucose-Capillary: 117 mg/dL — ABNORMAL HIGH (ref 70–99)
Glucose-Capillary: 93 mg/dL (ref 70–99)
Glucose-Capillary: 93 mg/dL (ref 70–99)

## 2017-08-16 LAB — BLOOD GAS, ARTERIAL
ACID-BASE EXCESS: 5.7 mmol/L — AB (ref 0.0–2.0)
Bicarbonate: 29.8 mmol/L — ABNORMAL HIGH (ref 20.0–28.0)
DRAWN BY: 517021
FIO2: 40
MECHVT: 640 mL
O2 SAT: 85.1 %
PEEP/CPAP: 8 cmH2O
PH ART: 7.424 (ref 7.350–7.450)
PO2 ART: 53.8 mmHg — AB (ref 83.0–108.0)
Patient temperature: 100.2
RATE: 24 resp/min
pCO2 arterial: 46.9 mmHg (ref 32.0–48.0)

## 2017-08-16 LAB — BASIC METABOLIC PANEL
Anion gap: 6 (ref 5–15)
BUN: 22 mg/dL — ABNORMAL HIGH (ref 6–20)
CO2: 30 mmol/L (ref 22–32)
CREATININE: 1 mg/dL (ref 0.61–1.24)
Calcium: 8.4 mg/dL — ABNORMAL LOW (ref 8.9–10.3)
Chloride: 107 mmol/L (ref 98–111)
GFR calc non Af Amer: >60 mL/min (ref >60)
GLUCOSE: 122 mg/dL — AB (ref 70–99)
Potassium: 3.4 mmol/L — ABNORMAL LOW (ref 3.5–5.1)
SODIUM: 143 mmol/L (ref 135–145)

## 2017-08-16 LAB — CBC
HCT: 28 % — ABNORMAL LOW (ref 39.0–52.0)
Hemoglobin: 8.7 g/dL — ABNORMAL LOW (ref 13.0–17.0)
MCH: 30.4 pg (ref 26.0–34.0)
MCHC: 31.1 g/dL (ref 30.0–36.0)
MCV: 97.9 fL (ref 78.0–100.0)
PLATELETS: 432 10*3/uL — AB (ref 150–400)
RBC: 2.86 MIL/uL — AB (ref 4.22–5.81)
RDW: 15.3 % (ref 11.5–15.5)
WBC: 18.7 10*3/uL — AB (ref 4.0–10.5)

## 2017-08-16 LAB — VANCOMYCIN, TROUGH: Vancomycin Tr: 16 ug/mL (ref 15–20)

## 2017-08-16 MED ORDER — SALINE SPRAY 0.65 % NA SOLN: 1.0000 | NASAL | Status: DC | PRN

## 2017-08-16 NOTE — Progress Notes (Signed)
Follow up - Trauma   Patient Details:    Jeffrey Haney is an 32 y.o. male.  Lines/tubes : Airway (Active)  Secured at (cm) 27 cm 08/15/2017  3:39 AM  Measured From Lips 08/15/2017  3:39 AM  Secured Location Right 08/15/2017  3:39 AM  Secured By [employer] 08/15/2017  3:39 AM  Tube Holder Repositioned Yes 08/15/2017  3:39 AM  Cuff Pressure (cm H2O) 26 cm H2O 08/13/2017 11:24 PM  Site Condition Dry 08/15/2017  3:39 AM     PICC Triple Lumen 08/08/17 PICC Right Brachial 41 cm 0 cm (Active)  Indication for Insertion or Continuance of Line Prolonged intravenous therapies 08/14/2017  8:00 PM  Exposed Catheter (cm) 0 cm 08/08/2017 12:00 PM  Site Assessment Clean;Dry;Intact 08/14/2017  8:00 PM  Lumen #1 Status Infusing;Blood return noted;Flushed 08/14/2017  8:00 PM  Lumen #2 Status Infusing;Blood return noted;Flushed 08/14/2017  8:00 PM  Lumen #3 Status Infusing;Blood return noted;Flushed 08/14/2017  8:00 PM  Dressing Type Transparent;Occlusive 08/14/2017  8:00 PM  Dressing Status Clean;Dry;Intact;Antimicrobial disc in place 08/14/2017  8:00 PM  Line Care Connections checked and tightened 08/14/2017  8:00 PM  Dressing Change Due 08/15/17 08/14/2017  8:00 PM     Closed System Drain 2 Lateral;Right RLQ (Active)  Site Description Unremarkable 08/14/2017  8:00 PM  Dressing Status Clean;Dry;Intact 08/14/2017  8:00 PM  Drainage Appearance Serosanguineous 08/14/2017  8:00 PM  Status To suction (Charged) 08/14/2017  8:00 PM  Intake (mL) 40 ml 08/10/2017  8:00 PM  Output (mL) 40 mL 08/15/2017  6:00 AM     Urethral Catheter Marylene Buerger RN  Latex;Straight-tip 16 Fr. (Active)  Indication for Insertion or Continuance of Catheter Acute urinary retention 08/14/2017  8:00 PM  Site Assessment Clean;Intact;Dry 08/14/2017  8:00 PM  Catheter Maintenance Bag below level of bladder;Catheter secured;Drainage bag/tubing not touching floor;No dependent loops;Seal intact 08/14/2017  8:00 PM  Collection Container  Standard drainage bag 08/14/2017  8:00 PM  Securement Method Securing device (Describe) 08/14/2017  8:00 PM  Urinary Catheter Interventions Unclamped 08/14/2017  4:00 PM  Output (mL) 275 mL 08/15/2017  6:00 AM    Microbiology/Sepsis markers: Results for orders placed or performed during the hospital encounter of 08/04/17  MRSA PCR Screening     Status: None   Collection Time: 08/05/17  4:44 AM  Result Value Ref Range Status   MRSA by PCR NEGATIVE NEGATIVE Final    Comment:        The GeneXpert MRSA Assay (FDA approved for NASAL specimens only), is one component of a comprehensive MRSA colonization surveillance program. It is not intended to diagnose MRSA infection nor to guide or monitor treatment for MRSA infections. Performed at [facility], [address] [place], [place] [address]   Culture, respiratory (NON-Expectorated)     Status: None   Collection Time: 08/08/17  8:07 AM  Result Value Ref Range Status   Specimen Description TRACHEAL ASPIRATE  Final   Special Requests NONE  Final   Gram Stain   Final    MODERATE WBC PRESENT, PREDOMINANTLY PMN RARE GRAM POSITIVE COCCI    Culture   Final    Consistent with normal respiratory flora. Performed at [facility], [address], [place], [place] [address]    Report Status 08/10/2017 FINAL  Final  Culture, Urine     Status: None   Collection Time: 08/08/17  8:56 AM  Result Value Ref Range Status   Specimen Description URINE, CATHETERIZED  Final   Special Requests NONE  Final   Culture   Final    NO GROWTH Performed at [facility], [address] [place], [place] [address]    Report Status 08/09/2017 FINAL  Final  Culture, blood (routine x 2)     Status: Abnormal   Collection Time: 08/08/17  9:00 AM  Result Value Ref Range Status   Specimen Description BLOOD RIGHT ANTECUBITAL  Final   Special Requests   Final    BOTTLES DRAWN AEROBIC AND ANAEROBIC Blood Culture adequate volume   Culture  Setup  Time   Final    GRAM POSITIVE COCCI IN CLUSTERS ANAEROBIC BOTTLE ONLY CRITICAL RESULT CALLED TO, READ BACK BY AND VERIFIED WITH: B. MANCHERIL, RPHARMD AT 0908 ON 08/09/17 BY C. JESSUP, MLT.    Culture (A)  Final    STAPHYLOCOCCUS EPIDERMIDIS SUSCEPTIBILITIES PERFORMED ON PREVIOUS CULTURE WITHIN THE LAST 5 DAYS. Performed at [facility], [address] [place], [place] [address]    Report Status 08/11/2017 FINAL  Final   Organism ID, Bacteria STAPHYLOCOCCUS EPIDERMIDIS  Final      Susceptibility   Staphylococcus epidermidis - MIC*    CIPROFLOXACIN <=0.5 SENSITIVE Sensitive     ERYTHROMYCIN <=0.25 SENSITIVE Sensitive     GENTAMICIN <=0.5 SENSITIVE Sensitive     OXACILLIN <=0.25 SENSITIVE Sensitive     TETRACYCLINE <=1 SENSITIVE Sensitive     VANCOMYCIN 1 SENSITIVE Sensitive     TRIMETH/SULFA 160 RESISTANT Resistant     CLINDAMYCIN <=0.25 SENSITIVE Sensitive     RIFAMPIN <=0.5 SENSITIVE Sensitive     Inducible Clindamycin NEGATIVE Sensitive     * STAPHYLOCOCCUS EPIDERMIDIS  Culture, blood (routine x 2)     Status: Abnormal   Collection Time: 08/08/17  9:00 AM  Result Value Ref Range Status   Specimen Description BLOOD BLOOD RIGHT FOREARM  Final   Special Requests   Final    BOTTLES DRAWN AEROBIC AND ANAEROBIC Blood Culture adequate volume   Culture  Setup Time   Final    GRAM POSITIVE COCCI AEROBIC BOTTLE ONLY CRITICAL RESULT CALLED TO, READ BACK BY AND VERIFIED WITH: A. MANCHERIL, AT 0908 ON 08/09/17 BY C. JESSUP, MLT.    Culture (A)  Final    STAPHYLOCOCCUS EPIDERMIDIS SUSCEPTIBILITIES PERFORMED ON PREVIOUS CULTURE WITHIN THE LAST 5 DAYS. Performed at [facility], [address] [place], [place] [address]    Report Status 08/11/2017 FINAL  Final  Blood Culture ID Panel (Reflexed)     Status: Abnormal   Collection Time: 08/08/17  9:00 AM  Result Value Ref Range Status   Enterococcus species NOT DETECTED NOT DETECTED Final   Listeria monocytogenes NOT DETECTED  NOT DETECTED Final   Staphylococcus species DETECTED (A) NOT DETECTED Final    Comment: Methicillin (oxacillin) susceptible coagulase negative staphylococcus. Possible blood culture contaminant (unless isolated from more than one blood culture draw or clinical case suggests pathogenicity). No antibiotic treatment is indicated for blood  culture contaminants. CRITICAL RESULT CALLED TO, READ BACK BY AND VERIFIED WITH: A. MANCHERIL, RPHARMD AT 0908 ON 08/09/17 BY C. JESSUP, MLT.    Staphylococcus aureus NOT DETECTED NOT DETECTED Final   Methicillin resistance NOT DETECTED NOT DETECTED Final   Streptococcus species NOT DETECTED NOT DETECTED Final   Streptococcus agalactiae NOT DETECTED NOT DETECTED Final   Streptococcus pneumoniae NOT DETECTED NOT DETECTED Final   Streptococcus pyogenes NOT DETECTED NOT DETECTED Final   Acinetobacter baumannii NOT DETECTED NOT  DETECTED Final   Enterobacteriaceae species NOT DETECTED NOT DETECTED Final   Enterobacter cloacae complex NOT DETECTED NOT DETECTED Final   Escherichia coli NOT DETECTED NOT DETECTED Final   Klebsiella oxytoca NOT DETECTED NOT DETECTED Final   Klebsiella pneumoniae NOT DETECTED NOT DETECTED Final   Proteus species NOT DETECTED NOT DETECTED Final   Serratia marcescens NOT DETECTED NOT DETECTED Final   Haemophilus influenzae NOT DETECTED NOT DETECTED Final   Neisseria meningitidis NOT DETECTED NOT DETECTED Final   Pseudomonas aeruginosa NOT DETECTED NOT DETECTED Final   Candida albicans NOT DETECTED NOT DETECTED Final   Candida glabrata NOT DETECTED NOT DETECTED Final   Candida krusei NOT DETECTED NOT DETECTED Final   Candida parapsilosis NOT DETECTED NOT DETECTED Final   Candida tropicalis NOT DETECTED NOT DETECTED Final    Comment: Performed at [facility], [address] [place], [place] [address]  Culture, respiratory (NON-Expectorated)     Status: None   Collection Time: 08/11/17  8:46 AM  Result Value Ref Range Status    Specimen Description TRACHEAL ASPIRATE  Final   Special Requests Normal  Final   Gram Stain   Final    FEW WBC PRESENT, PREDOMINANTLY PMN NO ORGANISMS SEEN Performed at [facility], [address], [place], [place] [address]    Culture   Final    FEW PSEUDOMONAS AERUGINOSA FEW METHICILLIN RESISTANT STAPHYLOCOCCUS AUREUS    Report Status 08/14/2017 FINAL  Final   Organism ID, Bacteria PSEUDOMONAS AERUGINOSA  Final   Organism ID, Bacteria METHICILLIN RESISTANT STAPHYLOCOCCUS AUREUS  Final      Susceptibility   Methicillin resistant staphylococcus aureus - MIC*    CIPROFLOXACIN <=0.5 SENSITIVE Sensitive     ERYTHROMYCIN <=0.25 SENSITIVE Sensitive     GENTAMICIN <=0.5 SENSITIVE Sensitive     OXACILLIN RESISTANT Resistant     TETRACYCLINE <=1 SENSITIVE Sensitive     VANCOMYCIN <=0.5 SENSITIVE Sensitive     TRIMETH/SULFA <=10 SENSITIVE Sensitive     CLINDAMYCIN <=0.25 SENSITIVE Sensitive     RIFAMPIN <=0.5 SENSITIVE Sensitive     Inducible Clindamycin NEGATIVE Sensitive     * FEW METHICILLIN RESISTANT STAPHYLOCOCCUS AUREUS   Pseudomonas aeruginosa - MIC*    CEFTAZIDIME 4 SENSITIVE Sensitive     CIPROFLOXACIN <=0.25 SENSITIVE Sensitive     GENTAMICIN <=1 SENSITIVE Sensitive     IMIPENEM 2 SENSITIVE Sensitive     PIP/TAZO 8 SENSITIVE Sensitive     CEFEPIME 2 SENSITIVE Sensitive     * FEW PSEUDOMONAS AERUGINOSA    Anti-infectives:  Anti-infectives (From admission, onward)   Start     Dose/Rate Route Frequency Ordered Stop   08/14/17 2200  vancomycin (VANCOCIN) IVPB 1000 mg/200 mL premix     1,000 mg 200 mL/hr over 60 Minutes Intravenous Every 8 hours 08/14/17 1313     08/14/17 1400  vancomycin (VANCOCIN) 1,500 mg in sodium chloride 0.9 % 500 mL IVPB     1,500 mg 250 mL/hr over 120 Minutes Intravenous  Once 08/14/17 1313 08/14/17 1608   08/08/17 0815  piperacillin-tazobactam (ZOSYN) IVPB 3.375 g  Status:  Discontinued     3.375 g 12.5 mL/hr over 240 Minutes Intravenous  Every 8 hours 08/08/17 0807 08/15/17 0915      Best Practice/Protocols:  VTE Prophylaxis: Lovenox (prophylaxtic dose) Continous Sedation  Consults: Treatment Team:  Md, Trauma, MD    Studies:    Events:  Subjective:    Overnight Issues: none  Objective:  Vital  signs for last 24 hours: Temp:  [98.1 F (36.7 C)-100.8 F (38.2 C)] 98.7 F (37.1 C) (07/20 0800) Pulse Rate:  [90-129] 102 (07/20 1000) Resp:  [17-29] 24 (07/20 1000) BP: (117-195)/(61-81) 127/72 (07/20 1000) SpO2:  [91 %-100 %] 100 % (07/20 1000) FiO2 (%):  [40 %-60 %] 50 % (07/20 0800) Weight:  [89.5 kg (197 lb 5 oz)] 89.5 kg (197 lb 5 oz) (07/20 0500)  Hemodynamic parameters for last 24 hours:    Intake/Output from previous day: 07/19 0701 - 07/20 0700 In: 6726.6 [I.V.:2687.3; NG/GT:3360; IV Piggyback:679.2] Out: 3965 [Urine:3875; Drains:90]  Intake/Output this shift: Total I/O In: 857.8 [I.V.:312.8; NG/GT:495; IV Piggyback:50] Out: -   Vent settings for last 24 hours: Vent Mode: PRVC FiO2 (%):  [40 %-60 %] 50 % Set Rate:  [24 bmp] 24 bmp Vt Set:  [640 mL] 640 mL PEEP:  [8 cmH20] 8 cmH20 Plateau Pressure:  [31 cmH20-35 cmH20] 32 cmH20  Physical Exam:  General: on vent Neuro: sedated HEENT/Neck: ETT and collar Resp: some rhonchi CVS: RRR GI: wound clean and soft Extremities: edema 1+  Results for orders placed or performed during the hospital encounter of 08/04/17 (from the past 24 hour(s))  Blood gas, arterial     Status: Abnormal   Collection Time: 08/15/17 11:15 AM  Result Value Ref Range   FIO2 40.00    Delivery systems VENTILATOR    Mode PRESSURE REGULATED VOLUME CONTROL    VT 640.0 mL   LHR 24.0 resp/min   Peep/cpap 8.0 cm H20   pH, Arterial 7.409 7.350 - 7.450   pCO2 arterial 44.7 32.0 - 48.0 mmHg   pO2, Arterial 123 (H) 83.0 - 108.0 mmHg   Bicarbonate 27.6 20.0 - 28.0 mmol/L   Acid-Base Excess 3.3 (H) 0.0 - 2.0 mmol/L   O2 Saturation 98.5 %   Patient temperature 98.9     Collection site LEFT RADIAL    Drawn by [phone]    Sample type ARTERIAL DRAW    Allens test (pass/fail) PASS PASS  Glucose, capillary     Status: Abnormal   Collection Time: 08/15/17 11:46 AM  Result Value Ref Range   Glucose-Capillary 102 (H) 70 - 99 mg/dL   Comment 1 Notify RN   Glucose, capillary     Status: Abnormal   Collection Time: 08/15/17  4:31 PM  Result Value Ref Range   Glucose-Capillary 129 (H) 70 - 99 mg/dL  Glucose, capillary     Status: Abnormal   Collection Time: 08/15/17  7:25 PM  Result Value Ref Range   Glucose-Capillary 103 (H) 70 - 99 mg/dL  Glucose, capillary     Status: None   Collection Time: 08/16/17 12:00 AM  Result Value Ref Range   Glucose-Capillary 93 70 - 99 mg/dL  Blood gas, arterial     Status: Abnormal   Collection Time: 08/16/17  4:18 AM  Result Value Ref Range   FIO2 40.00    Delivery systems VENTILATOR    Mode PRESSURE REGULATED VOLUME CONTROL    VT 640 mL   LHR 24 resp/min   Peep/cpap 8.0 cm H20   pH, Arterial 7.424 7.350 - 7.450   pCO2 arterial 46.9 32.0 - 48.0 mmHg   pO2, Arterial 53.8 (L) 83.0 - 108.0 mmHg   Bicarbonate 29.8 (H) 20.0 - 28.0 mmol/L   Acid-Base Excess 5.7 (H) 0.0 - 2.0 mmol/L   O2 Saturation 85.1 %   Patient temperature 100.2    Collection site LEFT RADIAL  Drawn by [phone]    Sample type ARTERIAL DRAW    Allens test (pass/fail) PASS PASS  CBC     Status: Abnormal   Collection Time: 08/16/17  4:57 AM  Result Value Ref Range   WBC 18.7 (H) 4.0 - 10.5 K/uL   RBC 2.86 (L) 4.22 - 5.81 MIL/uL   Hemoglobin 8.7 (L) 13.0 - 17.0 g/dL   HCT 04.5 (L) 40.9 - 81.1 %   MCV 97.9 78.0 - 100.0 fL   MCH 30.4 26.0 - 34.0 pg   MCHC 31.1 30.0 - 36.0 g/dL   RDW 91.4 78.2 - 95.6 %   Platelets 432 (H) 150 - 400 K/uL  Basic metabolic panel     Status: Abnormal   Collection Time: 08/16/17  4:57 AM  Result Value Ref Range   Sodium 143 135 - 145 mmol/L   Potassium 3.4 (L) 3.5 - 5.1 mmol/L   Chloride 107 98 - 111 mmol/L   CO2 30  22 - 32 mmol/L   Glucose, Bld 122 (H) 70 - 99 mg/dL   BUN 22 (H) 6 - 20 mg/dL   Creatinine, Ser 2.13 0.61 - 1.24 mg/dL   Calcium 8.4 (L) 8.9 - 10.3 mg/dL   GFR calc non Af Amer >60 >60 mL/min   GFR calc Af Amer >60 >60 mL/min   Anion gap 6 5 - 15  Vancomycin, trough     Status: None   Collection Time: 08/16/17  4:57 AM  Result Value Ref Range   Vancomycin Tr 16 15 - 20 ug/mL    Assessment & Plan: Present on Admission: **None**    LOS: 11 days   Additional comments:I reviewed the patient's new clinical lab test results. 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 Dwain Sarna 7/9,  S/P removal of packs and closure 7/10 by Dr. Janee Morn- JP in place Acute hypoxic vent dependent respiratory failure/ARDS- transitioned to [facility] yesterday.  ABG this am is stable ID- Infected ARDS, Pseud/MRSA. Has completed 7d Zosyn so will stop this. Vanc d 3/7 L adrenal hematoma Acute kidney injury- resolved B hand lacerations- S/P closure, plan to remove sutures 7/18 ETOH abuse- Precedex, plan SBIRT once extubated ABL anemia FEN- free water for hypernatremia, Miralax. TF, Increase Klon/sero to wean drips VTE- Lovenox Dispo- ICU   08/16/2017  Vanita Panda, MD  Colorectal and General Surgery [facility]

## 2017-08-16 NOTE — Progress Notes (Signed)
Pharmacy Antibiotic Note  Jeffrey GottronDavid Haney is a 32 y.o. male admitted on 08/04/2017 with pneumonia.  VT - 16  Plan: Vancomycin 1000 mg IV q8h  Monitor renal fx cx vt prn - f/u stop on d7  Height: 6\' 1"  (185.4 cm) Weight: 197 lb 5 oz (89.5 kg) IBW/kg (Calculated) : 79.9  Temp (24hrs), Avg:99.9 F (37.7 C), Min:98.7 F (37.1 C), Max:100.8 F (38.2 C)  Recent Labs  Lab 08/12/17 0428 08/13/17 0429 08/14/17 0513 08/15/17 0435 08/16/17 0457  WBC 17.7* 16.9* 20.6* 20.7* 18.7*  CREATININE 1.59* 1.26* 1.18 1.04 1.00  VANCOTROUGH  --   --   --   --  16    Estimated Creatinine Clearance: 122.1 mL/min (by C-G formula based on SCr of 1 mg/dL).    No Known Allergies  Jeffrey BlissMichael Edana Haney, PharmD, BCPS, BCCCP Clinical Pharmacist [phone]  Please check AMION for all [facility] numbers  08/16/2017 1:03 PM

## 2017-08-17 LAB — GLUCOSE, CAPILLARY
GLUCOSE-CAPILLARY: 111 mg/dL — AB (ref 70–99)
Glucose-Capillary: 104 mg/dL — ABNORMAL HIGH (ref 70–99)
Glucose-Capillary: 104 mg/dL — ABNORMAL HIGH (ref 70–99)
Glucose-Capillary: 105 mg/dL — ABNORMAL HIGH (ref 70–99)
Glucose-Capillary: 110 mg/dL — ABNORMAL HIGH (ref 70–99)
Glucose-Capillary: 115 mg/dL — ABNORMAL HIGH (ref 70–99)
Glucose-Capillary: 124 mg/dL — ABNORMAL HIGH (ref 70–99)

## 2017-08-17 MED ORDER — QUETIAPINE FUMARATE 200 MG PO TABS
200.0000 mg | ORAL_TABLET | Freq: Once | ORAL | Status: AC
  Administered 2017-08-17: 200 mg via ORAL
  Filled 2017-08-17: qty 1

## 2017-08-17 MED ORDER — QUETIAPINE FUMARATE 200 MG PO TABS
400.0000 mg | ORAL_TABLET | Freq: Two times a day (BID) | ORAL | Status: DC
  Administered 2017-08-17 – 2017-08-21 (×6): 400 mg
  Filled 2017-08-17 (×6): qty 2

## 2017-08-17 MED ORDER — FUROSEMIDE 10 MG/ML IJ SOLN
40.0000 mg | Freq: Once | INTRAMUSCULAR | Status: AC
  Administered 2017-08-17: 40 mg via INTRAVENOUS
  Filled 2017-08-17: qty 4

## 2017-08-17 NOTE — Progress Notes (Addendum)
Follow up - Trauma Critical Care  Patient Details:    Jeffrey Haney is an 32 y.o. male.  Lines/tubes : Airway (Active)  Secured at (cm) 26 cm 08/17/2017  7:42 AM  Measured From Lips 08/17/2017  7:42 AM  Secured Location Left 08/17/2017  7:42 AM  Secured By [employer] T[employer] 08/17/2017  7:42 AM  Tube Holder Repositioned Yes 08/17/2017  7:42 AM  Cuff Pressure (cm H2O) 26 cm H2O 08/17/2017  7:42 AM  Site Condition Dry 08/17/2017  7:42 AM     PICC Triple Lumen 08/08/17 PICC Right Brachial 41 cm 0 cm (Active)  Indication for Insertion or Continuance of Line Prolonged intravenous therapies 08/16/2017  8:00 PM  Exposed Catheter (cm) 0 cm 08/08/2017 12:00 PM  Site Assessment Clean;Dry;Intact 08/16/2017  8:00 PM  Lumen #1 Status Infusing 08/16/2017  8:00 PM  Lumen #2 Status Infusing 08/16/2017  8:00 PM  Lumen #3 Status Infusing;In-line blood sampling system in place 08/16/2017  8:00 PM  Dressing Type Transparent;Occlusive 08/16/2017  8:00 PM  Dressing Status Clean;Dry;Intact 08/16/2017  8:00 PM  Line Care Connections checked and tightened 08/16/2017  8:00 PM  Dressing Intervention Dressing changed;Antimicrobial disc changed;Securement device changed 08/15/2017 12:00 PM  Dressing Change Due 08/22/17 08/16/2017  8:00 PM     Closed System Drain 2 Lateral;Right RLQ (Active)  Site Description Unremarkable 08/16/2017  8:00 PM  Dressing Status Clean;Dry;Intact 08/16/2017  8:00 PM  Drainage Appearance Serous 08/16/2017  8:00 PM  Status To suction (Charged) 08/16/2017  8:00 PM  Intake (mL) 40 ml 08/10/2017  8:00 PM  Output (mL) 20 mL 08/17/2017  6:25 AM     External Urinary Catheter (Active)  Collection Container Standard drainage bag 08/16/2017  8:00 PM  Securement Method Leg strap 08/16/2017  8:00 PM  Intervention Equipment Changed 08/15/2017  8:00 PM  Output (mL) 250 mL 08/17/2017  6:25 AM    Microbiology/Sepsis markers: Results for orders placed or performed during the hospital encounter of 08/04/17  MRSA  PCR Screening     Status: None   Collection Time: 08/05/17  4:44 AM  Result Value Ref Range Status   MRSA by PCR NEGATIVE NEGATIVE Final    Comment:        The GeneXpert MRSA Assay (FDA approved for NASAL specimens only), is one component of a comprehensive MRSA colonization surveillance program. It is not intended to diagnose MRSA infection nor to guide or monitor treatment for MRSA infections. Performed at [facility], [address] [place], [place] [address]   Culture, respiratory (NON-Expectorated)     Status: None   Collection Time: 08/08/17  8:07 AM  Result Value Ref Range Status   Specimen Description TRACHEAL ASPIRATE  Final   Special Requests NONE  Final   Gram Stain   Final    MODERATE WBC PRESENT, PREDOMINANTLY PMN RARE GRAM POSITIVE COCCI    Culture   Final    Consistent with normal respiratory flora. Performed at [facility], [address] [place], [place] [address]    Report Status 08/10/2017 FINAL  Final  Culture, Urine     Status: None   Collection Time: 08/08/17  8:56 AM  Result Value Ref Range Status   Specimen Description URINE, CATHETERIZED  Final   Special Requests NONE  Final   Culture   Final    NO GROWTH Performed at [facility], [address] [place], [place] [address]    Report Status 08/09/2017 FINAL  Final  Culture, blood (routine x 2)     Status: Abnormal   Collection Time: 08/08/17  9:00 AM  Result Value Ref Range Status   Specimen Description BLOOD RIGHT ANTECUBITAL  Final   Special Requests   Final    BOTTLES DRAWN AEROBIC AND ANAEROBIC Blood Culture adequate volume   Culture  Setup Time   Final    GRAM POSITIVE COCCI IN CLUSTERS ANAEROBIC BOTTLE ONLY CRITICAL RESULT CALLED TO, READ BACK BY AND VERIFIED WITH: B. MANCHERIL, RPHARMD AT 0908 ON 08/09/17 BY C. JESSUP, MLT.    Culture (A)  Final    STAPHYLOCOCCUS EPIDERMIDIS SUSCEPTIBILITIES PERFORMED ON PREVIOUS CULTURE WITHIN THE LAST 5 DAYS. Performed at  [facility], [address], [place], [place] [address]    Report Status 08/11/2017 FINAL  Final   Organism ID, Bacteria STAPHYLOCOCCUS EPIDERMIDIS  Final      Susceptibility   Staphylococcus epidermidis - MIC*    CIPROFLOXACIN <=0.5 SENSITIVE Sensitive     ERYTHROMYCIN <=0.25 SENSITIVE Sensitive     GENTAMICIN <=0.5 SENSITIVE Sensitive     OXACILLIN <=0.25 SENSITIVE Sensitive     TETRACYCLINE <=1 SENSITIVE Sensitive     VANCOMYCIN 1 SENSITIVE Sensitive     TRIMETH/SULFA 160 RESISTANT Resistant     CLINDAMYCIN <=0.25 SENSITIVE Sensitive     RIFAMPIN <=0.5 SENSITIVE Sensitive     Inducible Clindamycin NEGATIVE Sensitive     * STAPHYLOCOCCUS EPIDERMIDIS  Culture, blood (routine x 2)     Status: Abnormal   Collection Time: 08/08/17  9:00 AM  Result Value Ref Range Status   Specimen Description BLOOD BLOOD RIGHT FOREARM  Final   Special Requests   Final    BOTTLES DRAWN AEROBIC AND ANAEROBIC Blood Culture adequate volume   Culture  Setup Time   Final    GRAM POSITIVE COCCI AEROBIC BOTTLE ONLY CRITICAL RESULT CALLED TO, READ BACK BY AND VERIFIED WITH: A. MANCHERIL, AT 0908 ON 08/09/17 BY C. JESSUP, MLT.    Culture (A)  Final    STAPHYLOCOCCUS EPIDERMIDIS SUSCEPTIBILITIES PERFORMED ON PREVIOUS CULTURE WITHIN THE LAST 5 DAYS. Performed at [facility], [address], [place], [place] [address]    Report Status 08/11/2017 FINAL  Final  Blood Culture ID Panel (Reflexed)     Status: Abnormal   Collection Time: 08/08/17  9:00 AM  Result Value Ref Range Status   Enterococcus species NOT DETECTED NOT DETECTED Final   Listeria monocytogenes NOT DETECTED NOT DETECTED Final   Staphylococcus species DETECTED (A) NOT DETECTED Final    Comment: Methicillin (oxacillin) susceptible coagulase negative staphylococcus. Possible blood culture contaminant (unless isolated from more than one blood culture draw or clinical case suggests pathogenicity). No antibiotic treatment is indicated  for blood  culture contaminants. CRITICAL RESULT CALLED TO, READ BACK BY AND VERIFIED WITH: A. MANCHERIL, RPHARMD AT 0908 ON 08/09/17 BY C. JESSUP, MLT.    Staphylococcus aureus NOT DETECTED NOT DETECTED Final   Methicillin resistance NOT DETECTED NOT DETECTED Final   Streptococcus species NOT DETECTED NOT DETECTED Final   Streptococcus agalactiae NOT DETECTED NOT DETECTED Final   Streptococcus pneumoniae NOT DETECTED NOT DETECTED Final   Streptococcus pyogenes NOT DETECTED NOT DETECTED Final   Acinetobacter baumannii NOT DETECTED NOT DETECTED Final   Enterobacteriaceae species NOT DETECTED NOT DETECTED Final   Enterobacter cloacae complex NOT DETECTED NOT DETECTED Final   Escherichia coli NOT DETECTED NOT DETECTED Final   Klebsiella oxytoca NOT DETECTED NOT DETECTED Final  Klebsiella pneumoniae NOT DETECTED NOT DETECTED Final   Proteus species NOT DETECTED NOT DETECTED Final   Serratia marcescens NOT DETECTED NOT DETECTED Final   Haemophilus influenzae NOT DETECTED NOT DETECTED Final   Neisseria meningitidis NOT DETECTED NOT DETECTED Final   Pseudomonas aeruginosa NOT DETECTED NOT DETECTED Final   Candida albicans NOT DETECTED NOT DETECTED Final   Candida glabrata NOT DETECTED NOT DETECTED Final   Candida krusei NOT DETECTED NOT DETECTED Final   Candida parapsilosis NOT DETECTED NOT DETECTED Final   Candida tropicalis NOT DETECTED NOT DETECTED Final    Comment: Performed at [facility], [address], [place], [place] [address]  Culture, respiratory (NON-Expectorated)     Status: None   Collection Time: 08/11/17  8:46 AM  Result Value Ref Range Status   Specimen Description TRACHEAL ASPIRATE  Final   Special Requests Normal  Final   Gram Stain   Final    FEW WBC PRESENT, PREDOMINANTLY PMN NO ORGANISMS SEEN Performed at [facility], [address] [place], [place] [address]    Culture   Final    FEW PSEUDOMONAS AERUGINOSA FEW METHICILLIN RESISTANT  STAPHYLOCOCCUS AUREUS    Report Status 08/14/2017 FINAL  Final   Organism ID, Bacteria PSEUDOMONAS AERUGINOSA  Final   Organism ID, Bacteria METHICILLIN RESISTANT STAPHYLOCOCCUS AUREUS  Final      Susceptibility   Methicillin resistant staphylococcus aureus - MIC*    CIPROFLOXACIN <=0.5 SENSITIVE Sensitive     ERYTHROMYCIN <=0.25 SENSITIVE Sensitive     GENTAMICIN <=0.5 SENSITIVE Sensitive     OXACILLIN RESISTANT Resistant     TETRACYCLINE <=1 SENSITIVE Sensitive     VANCOMYCIN <=0.5 SENSITIVE Sensitive     TRIMETH/SULFA <=10 SENSITIVE Sensitive     CLINDAMYCIN <=0.25 SENSITIVE Sensitive     RIFAMPIN <=0.5 SENSITIVE Sensitive     Inducible Clindamycin NEGATIVE Sensitive     * FEW METHICILLIN RESISTANT STAPHYLOCOCCUS AUREUS   Pseudomonas aeruginosa - MIC*    CEFTAZIDIME 4 SENSITIVE Sensitive     CIPROFLOXACIN <=0.25 SENSITIVE Sensitive     GENTAMICIN <=1 SENSITIVE Sensitive     IMIPENEM 2 SENSITIVE Sensitive     PIP/TAZO 8 SENSITIVE Sensitive     CEFEPIME 2 SENSITIVE Sensitive     * FEW PSEUDOMONAS AERUGINOSA    Anti-infectives:  Anti-infectives (From admission, onward)   Start     Dose/Rate Route Frequency Ordered Stop   08/14/17 2200  vancomycin (VANCOCIN) IVPB 1000 mg/200 mL premix     1,000 mg 200 mL/hr over 60 Minutes Intravenous Every 8 hours 08/14/17 1313     08/14/17 1400  vancomycin (VANCOCIN) 1,500 mg in sodium chloride 0.9 % 500 mL IVPB     1,500 mg 250 mL/hr over 120 Minutes Intravenous  Once 08/14/17 1313 08/14/17 1608   08/08/17 0815  piperacillin-tazobactam (ZOSYN) IVPB 3.375 g  Status:  Discontinued     3.375 g 12.5 mL/hr over 240 Minutes Intravenous Every 8 hours 08/08/17 0807 08/15/17 0915      Best Practice/Protocols:  VTE Prophylaxis: Lovenox (prophylaxtic dose) Continous Sedation  Consults: Treatment Team:  Md, Trauma, MD    Studies:    Events:  Subjective:    Overnight Issues:   Objective:  Vital signs for last 24 hours: Temp:   [98.9 F (37.2 C)-99.3 F (37.4 C)] 98.9 F (37.2 C) (07/21 0800) Pulse Rate:  [91-111] 91 (07/21 0700) Resp:  [8-34] 24 (07/21 0700) BP: (115-142)/(63-79) 125/75 (07/21 0700) SpO2:  [95 %-  100 %] 98 % (07/21 0742) FiO2 (%):  [40 %] 40 % (07/21 0742) Weight:  [91.7 kg (202 lb 2.6 oz)] 91.7 kg (202 lb 2.6 oz) (07/21 0459)  Hemodynamic parameters for last 24 hours:    Intake/Output from previous day: 07/20 0701 - 07/21 0700 In: 6596.8 [I.V.:2698.6; ZO/XW:9604; IV Piggyback:733.2] Out: 4040 [Urine:3990; Drains:50]  Intake/Output this shift: No intake/output data recorded.  Vent settings for last 24 hours: Vent Mode: PSV FiO2 (%):  [40 %] 40 % Set Rate:  [24 bmp] 24 bmp Vt Set:  [640 mL] 640 mL PEEP:  [5 cmH20-8 cmH20] 5 cmH20 Pressure Support:  [10 cmH20] 10 cmH20 Plateau Pressure:  [29 cmH20-33 cmH20] 29 cmH20  Physical Exam:  General: on vent Neuro: awake on vent and F/C HEENT/Neck: ETT and no post midline neck tenderness, no pain on AROM - collar D/Cd Resp: rhonchi bilaterally CVS: regular rate and rhythm, S1, S2 normal, no murmur, click, rub or gallop GI: soft, incision CDI, drain now serous Extremities: edema 1+  Results for orders placed or performed during the hospital encounter of 08/04/17 (from the past 24 hour(s))  Glucose, capillary     Status: None   Collection Time: 08/16/17 11:24 AM  Result Value Ref Range   Glucose-Capillary 93 70 - 99 mg/dL   Comment 1 Notify RN    Comment 2 Document in Chart   Glucose, capillary     Status: Abnormal   Collection Time: 08/16/17  3:39 PM  Result Value Ref Range   Glucose-Capillary 130 (H) 70 - 99 mg/dL  Glucose, capillary     Status: Abnormal   Collection Time: 08/16/17  7:48 PM  Result Value Ref Range   Glucose-Capillary 117 (H) 70 - 99 mg/dL  Glucose, capillary     Status: Abnormal   Collection Time: 08/17/17 12:05 AM  Result Value Ref Range   Glucose-Capillary 104 (H) 70 - 99 mg/dL  Glucose, capillary      Status: Abnormal   Collection Time: 08/17/17  4:07 AM  Result Value Ref Range   Glucose-Capillary 115 (H) 70 - 99 mg/dL  Glucose, capillary     Status: Abnormal   Collection Time: 08/17/17  7:34 AM  Result Value Ref Range   Glucose-Capillary 110 (H) 70 - 99 mg/dL    Assessment & Plan: Present on Admission: **None**    LOS: 12 days   Additional comments:I reviewed the patient's new clinical lab test results. 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 Dwain Sarna 7/9,  S/P removal of packs and closure 7/10 by Dr. Janee Morn- JP in place may d/c tomorrow Acute hypoxic vent dependent respiratory failure/ARDS- weaning on 10/5 ID- Infected ARDS, Pseud/MRSA. Has completed 7d Zosyn. Vanc d 5/7 L adrenal hematoma Acute kidney injury- resolved B hand lacerations- S/P closure, plan to remove sutures 7/18 ETOH abuse- Precedex, plan SBIRT once extubated ABL anemia FEN- lasix 40mg  X 1 now.  free water for hypernatremia, Miralax. TF, Increase sero to wean drips VTE- Lovenox Dispo- ICU I spoke with his mother. C spine cleared. Critical Care Total Time*: 79 Minutes  Violeta Gelinas, MD, MPH, [facility] Trauma: [phone] General Surgery: [phone]  08/17/2017  *Care during the described time interval was provided by me. I have reviewed this patient's available data, including medical history, events of note, physical examination and test results as part of my evaluation.  Patient ID: Jeffrey Hua  Haney, male   DOB: 1985/01/30, 32 y.o.   MRN: [id number]

## 2017-08-17 NOTE — Progress Notes (Signed)
0740 changed vent mode to PSV 10/5 40% in order to wean patient tolerated well, with SATS at 96%, TVe at 487 ml, and RR 22 BPM, BP 122/71, HR 109 BPM, will continue to monitor patient.

## 2017-08-17 NOTE — Progress Notes (Signed)
[facility] back to [facility] due to increaes RR to 45 BPM

## 2017-08-17 NOTE — Progress Notes (Signed)
ETT advanced 3cm to 28 cm at lip from 25 cm at lip per MD's order.

## 2017-08-18 ENCOUNTER — Inpatient Hospital Stay (HOSPITAL_COMMUNITY): Payer: Self-pay

## 2017-08-18 LAB — BASIC METABOLIC PANEL
ANION GAP: 9 (ref 5–15)
BUN: 21 mg/dL — ABNORMAL HIGH (ref 6–20)
CALCIUM: 8.4 mg/dL — AB (ref 8.9–10.3)
CO2: 29 mmol/L (ref 22–32)
CREATININE: 0.93 mg/dL (ref 0.61–1.24)
Chloride: 103 mmol/L (ref 98–111)
GFR calc Af Amer: >60 mL/min (ref >60)
GLUCOSE: 116 mg/dL — AB (ref 70–99)
Potassium: 3.5 mmol/L (ref 3.5–5.1)
SODIUM: 141 mmol/L (ref 135–145)

## 2017-08-18 LAB — CBC
HCT: 27.8 % — ABNORMAL LOW (ref 39.0–52.0)
Hemoglobin: 8.7 g/dL — ABNORMAL LOW (ref 13.0–17.0)
MCH: 30.5 pg (ref 26.0–34.0)
MCHC: 31.3 g/dL (ref 30.0–36.0)
MCV: 97.5 fL (ref 78.0–100.0)
PLATELETS: 618 10*3/uL — AB (ref 150–400)
RBC: 2.85 MIL/uL — AB (ref 4.22–5.81)
RDW: 15.3 % (ref 11.5–15.5)
WBC: 16.2 10*3/uL — ABNORMAL HIGH (ref 4.0–10.5)

## 2017-08-18 LAB — GLUCOSE, CAPILLARY
GLUCOSE-CAPILLARY: 102 mg/dL — AB (ref 70–99)
Glucose-Capillary: 104 mg/dL — ABNORMAL HIGH (ref 70–99)
Glucose-Capillary: 117 mg/dL — ABNORMAL HIGH (ref 70–99)
Glucose-Capillary: 110 mg/dL — ABNORMAL HIGH (ref 70–99)
Glucose-Capillary: 105 mg/dL — ABNORMAL HIGH (ref 70–99)

## 2017-08-18 LAB — BLOOD GAS, ARTERIAL
Acid-Base Excess: 7.7 mmol/L — ABNORMAL HIGH (ref 0.0–2.0)
BICARBONATE: 32.6 mmol/L — AB (ref 20.0–28.0)
Drawn by: 23703
FIO2: 40
O2 Saturation: 95.5 %
PATIENT TEMPERATURE: 98.6
PEEP: 5 cmH2O
PO2 ART: 84.2 mmHg (ref 83.0–108.0)
Pressure support: 10 cmH2O
pCO2 arterial: 53.8 mmHg — ABNORMAL HIGH (ref 32.0–48.0)
pH, Arterial: 7.399 (ref 7.350–7.450)

## 2017-08-18 MED ORDER — PANTOPRAZOLE SODIUM 40 MG PO PACK
40.0000 mg | PACK | Freq: Every day | ORAL | Status: DC
  Administered 2017-08-18 – 2017-08-19 (×2): 40 mg
  Filled 2017-08-18 (×3): qty 20

## 2017-08-18 MED ORDER — DOCUSATE SODIUM 50 MG/5ML PO LIQD
100.0000 mg | Freq: Two times a day (BID) | ORAL | Status: DC
  Administered 2017-08-18 – 2017-08-19 (×2): 100 mg
  Filled 2017-08-18 (×3): qty 10

## 2017-08-18 MED ORDER — FENTANYL 25 MCG/HR TD PT72
100.0000 ug | MEDICATED_PATCH | TRANSDERMAL | Status: DC
  Administered 2017-08-18 – 2017-08-21 (×2): 100 ug via TRANSDERMAL
  Filled 2017-08-18: qty 4
  Filled 2017-08-18 (×2): qty 2

## 2017-08-18 MED ORDER — FUROSEMIDE 10 MG/ML IJ SOLN
40.0000 mg | Freq: Once | INTRAMUSCULAR | Status: AC
  Administered 2017-08-18: 40 mg via INTRAVENOUS
  Filled 2017-08-18: qty 4

## 2017-08-18 NOTE — Progress Notes (Signed)
Patient continues to tolerate CPAP/PS well. Will continue to monitor patient.
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
## 2017-08-19 ENCOUNTER — Inpatient Hospital Stay (HOSPITAL_COMMUNITY): Payer: Self-pay

## 2017-08-19 LAB — CBC WITH DIFFERENTIAL/PLATELET
Abs Immature Granulocytes: 0.3 10*3/uL — ABNORMAL HIGH (ref 0.0–0.1)
Basophils Absolute: 0.1 10*3/uL (ref 0.0–0.1)
Basophils Relative: 0 %
Eosinophils Absolute: 0.6 10*3/uL (ref 0.0–0.7)
Eosinophils Relative: 4 %
HEMATOCRIT: 24.4 % — AB (ref 39.0–52.0)
Hemoglobin: 7.6 g/dL — ABNORMAL LOW (ref 13.0–17.0)
IMMATURE GRANULOCYTES: 2 %
LYMPHS PCT: 11 %
Lymphs Abs: 1.7 10*3/uL (ref 0.7–4.0)
MCH: 30.5 pg (ref 26.0–34.0)
MCHC: 31.1 g/dL (ref 30.0–36.0)
MCV: 98 fL (ref 78.0–100.0)
MONOS PCT: 10 %
Monocytes Absolute: 1.5 10*3/uL — ABNORMAL HIGH (ref 0.1–1.0)
NEUTROS PCT: 73 %
Neutro Abs: 11.5 10*3/uL — ABNORMAL HIGH (ref 1.7–7.7)
Platelets: 636 10*3/uL — ABNORMAL HIGH (ref 150–400)
RBC: 2.49 MIL/uL — AB (ref 4.22–5.81)
RDW: 15.4 % (ref 11.5–15.5)
WBC: 15.7 10*3/uL — AB (ref 4.0–10.5)

## 2017-08-19 LAB — GLUCOSE, CAPILLARY
GLUCOSE-CAPILLARY: 88 mg/dL (ref 70–99)
GLUCOSE-CAPILLARY: 101 mg/dL — AB (ref 70–99)
GLUCOSE-CAPILLARY: 84 mg/dL (ref 70–99)
GLUCOSE-CAPILLARY: 115 mg/dL — AB (ref 70–99)
Glucose-Capillary: 105 mg/dL — ABNORMAL HIGH (ref 70–99)
Glucose-Capillary: 107 mg/dL — ABNORMAL HIGH (ref 70–99)

## 2017-08-19 LAB — BASIC METABOLIC PANEL
Anion gap: 6 (ref 5–15)
BUN: 19 mg/dL (ref 6–20)
CALCIUM: 7.9 mg/dL — AB (ref 8.9–10.3)
CHLORIDE: 106 mmol/L (ref 98–111)
CO2: 28 mmol/L (ref 22–32)
Creatinine, Ser: 0.75 mg/dL (ref 0.61–1.24)
GFR calc non Af Amer: >60 mL/min (ref >60)
GLUCOSE: 103 mg/dL — AB (ref 70–99)
Potassium: 3.2 mmol/L — ABNORMAL LOW (ref 3.5–5.1)
Sodium: 140 mmol/L (ref 135–145)

## 2017-08-19 MED ORDER — POTASSIUM CHLORIDE 20 MEQ/15ML (10%) PO SOLN
40.0000 meq | Freq: Two times a day (BID) | ORAL | Status: AC
  Administered 2017-08-19 (×2): 40 meq
  Filled 2017-08-19 (×2): qty 30

## 2017-08-19 MED ORDER — FUROSEMIDE 10 MG/ML IJ SOLN
40.0000 mg | Freq: Once | INTRAMUSCULAR | Status: AC
  Administered 2017-08-19: 40 mg via INTRAVENOUS
  Filled 2017-08-19: qty 4

## 2017-08-19 NOTE — Progress Notes (Signed)
Attempting to slowly wean sedation per verbal order from Dr. Janee Mornhompson. At 1045, HR in the 170s, RR in the 40s, pt very agitated. Bolused with Fentanyl and Versed. Put back on full support by RT.
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
## 2017-08-19 NOTE — Progress Notes (Signed)
Patient was tried on CPAP/PS of 10/5 this morning. The patient did not tolerate at this time with RR in high 30's-40's. The patient's VT was 150-200.  Will continue to monitor for wean as the patient becomes more alert.

## 2017-08-19 NOTE — Progress Notes (Signed)
   08/19/17 1100  Clinical Encounter Type  Visited With Patient and family together  Visit Type Follow-up  Referral From Chaplain  Consult/Referral To Chaplain  Spiritual Encounters  Spiritual Needs Emotional  Stress Factors  Family Stress Factors Financial concerns;Health changes  Chaplain has had many positive and enlightening interaction with the family over the last few days.  The Chaplain was able to pray with the family and converse with the mother and brother.

## 2017-08-19 NOTE — Progress Notes (Addendum)
Patient ID: Jeffrey Haney, male   DOB: 1986/01/17, 32 y.o.   MRN: [id number] Follow up - Trauma Critical Care  Patient Details:    Jeffrey Haney is an 32 y.o. male.  Lines/tubes : Airway 8 mm (Active)  Secured at (cm) 28 cm 08/19/2017  7:20 AM  Measured From Lips 08/19/2017  7:20 AM  Secured Location Left 08/19/2017  7:20 AM  Secured By [employer] 08/19/2017  8:00 AM  Tube Holder Repositioned Yes 08/19/2017  7:20 AM  Cuff Pressure (cm H2O) 22 cm H2O 08/19/2017  7:20 AM  Site Condition Dry 08/19/2017  8:00 AM     PICC Triple Lumen 08/08/17 PICC Right Brachial 41 cm 0 cm (Active)  Indication for Insertion or Continuance of Line Prolonged intravenous therapies 08/19/2017  8:00 AM  Exposed Catheter (cm) 0 cm 08/08/2017 12:00 PM  Site Assessment Clean;Dry;Intact 08/19/2017  8:00 AM  Lumen #1 Status Infusing 08/19/2017  8:00 AM  Lumen #2 Status Infusing 08/19/2017  8:00 AM  Lumen #3 Status Infusing;In-line blood sampling system in place 08/19/2017  8:00 AM  Dressing Type Transparent;Occlusive 08/19/2017  8:00 AM  Dressing Status Clean;Dry;Intact;Antimicrobial disc in place 08/19/2017  8:00 AM  Line Care Connections checked and tightened 08/19/2017  8:00 AM  Dressing Intervention Dressing changed;Antimicrobial disc changed;Securement device changed 08/15/2017 12:00 PM  Dressing Change Due 08/22/17 08/19/2017  8:00 AM     Closed System Drain 2 Lateral;Right RLQ (Active)  Site Description Unremarkable 08/19/2017  8:00 AM  Dressing Status Clean;Dry;Intact 08/19/2017  8:00 AM  Drainage Appearance Serous 08/19/2017  8:00 AM  Status To suction (Charged) 08/19/2017  8:00 AM  Intake (mL) 40 ml 08/10/2017  8:00 PM  Output (mL) 5 mL 08/19/2017  6:00 AM     External Urinary Catheter (Active)  Collection Container Standard drainage bag 08/19/2017  8:00 AM  Securement Method Leg strap 08/19/2017  8:00 AM  Intervention Equipment Changed 08/15/2017  8:00 PM  Output (mL) 525 mL 08/19/2017  6:00 AM     Microbiology/Sepsis markers: Results for orders placed or performed during the hospital encounter of 08/04/17  MRSA PCR Screening     Status: None   Collection Time: 08/05/17  4:44 AM  Result Value Ref Range Status   MRSA by PCR NEGATIVE NEGATIVE Final    Comment:        The GeneXpert MRSA Assay (FDA approved for NASAL specimens only), is one component of a comprehensive MRSA colonization surveillance program. It is not intended to diagnose MRSA infection nor to guide or monitor treatment for MRSA infections. Performed at [facility], [address] [place], [place] [address]   Culture, respiratory (NON-Expectorated)     Status: None   Collection Time: 08/08/17  8:07 AM  Result Value Ref Range Status   Specimen Description TRACHEAL ASPIRATE  Final   Special Requests NONE  Final   Gram Stain   Final    MODERATE WBC PRESENT, PREDOMINANTLY PMN RARE GRAM POSITIVE COCCI    Culture   Final    Consistent with normal respiratory flora. Performed at [facility], [address], [place], [place] [address]    Report Status 08/10/2017 FINAL  Final  Culture, Urine     Status: None   Collection Time: 08/08/17  8:56 AM  Result Value Ref Range Status   Specimen Description URINE, CATHETERIZED  Final   Special Requests NONE  Final   Culture   Final    NO GROWTH Performed  at [facility], [address], [place], [place] [address]    Report Status 08/09/2017 FINAL  Final  Culture, blood (routine x 2)     Status: Abnormal   Collection Time: 08/08/17  9:00 AM  Result Value Ref Range Status   Specimen Description BLOOD RIGHT ANTECUBITAL  Final   Special Requests   Final    BOTTLES DRAWN AEROBIC AND ANAEROBIC Blood Culture adequate volume   Culture  Setup Time   Final    GRAM POSITIVE COCCI IN CLUSTERS ANAEROBIC BOTTLE ONLY CRITICAL RESULT CALLED TO, READ BACK BY AND VERIFIED WITH: B. MANCHERIL, RPHARMD AT 0908 ON 08/09/17 BY C. JESSUP, MLT.    Culture (A)   Final    STAPHYLOCOCCUS EPIDERMIDIS SUSCEPTIBILITIES PERFORMED ON PREVIOUS CULTURE WITHIN THE LAST 5 DAYS. Performed at [facility], [address], [place], [place] [address]    Report Status 08/11/2017 FINAL  Final   Organism ID, Bacteria STAPHYLOCOCCUS EPIDERMIDIS  Final      Susceptibility   Staphylococcus epidermidis - MIC*    CIPROFLOXACIN <=0.5 SENSITIVE Sensitive     ERYTHROMYCIN <=0.25 SENSITIVE Sensitive     GENTAMICIN <=0.5 SENSITIVE Sensitive     OXACILLIN <=0.25 SENSITIVE Sensitive     TETRACYCLINE <=1 SENSITIVE Sensitive     VANCOMYCIN 1 SENSITIVE Sensitive     TRIMETH/SULFA 160 RESISTANT Resistant     CLINDAMYCIN <=0.25 SENSITIVE Sensitive     RIFAMPIN <=0.5 SENSITIVE Sensitive     Inducible Clindamycin NEGATIVE Sensitive     * STAPHYLOCOCCUS EPIDERMIDIS  Culture, blood (routine x 2)     Status: Abnormal   Collection Time: 08/08/17  9:00 AM  Result Value Ref Range Status   Specimen Description BLOOD BLOOD RIGHT FOREARM  Final   Special Requests   Final    BOTTLES DRAWN AEROBIC AND ANAEROBIC Blood Culture adequate volume   Culture  Setup Time   Final    GRAM POSITIVE COCCI AEROBIC BOTTLE ONLY CRITICAL RESULT CALLED TO, READ BACK BY AND VERIFIED WITH: A. MANCHERIL, AT 0908 ON 08/09/17 BY C. JESSUP, MLT.    Culture (A)  Final    STAPHYLOCOCCUS EPIDERMIDIS SUSCEPTIBILITIES PERFORMED ON PREVIOUS CULTURE WITHIN THE LAST 5 DAYS. Performed at [facility], [address], [place], [place] [address]    Report Status 08/11/2017 FINAL  Final  Blood Culture ID Panel (Reflexed)     Status: Abnormal   Collection Time: 08/08/17  9:00 AM  Result Value Ref Range Status   Enterococcus species NOT DETECTED NOT DETECTED Final   Listeria monocytogenes NOT DETECTED NOT DETECTED Final   Staphylococcus species DETECTED (A) NOT DETECTED Final    Comment: Methicillin (oxacillin) susceptible coagulase negative staphylococcus. Possible blood culture contaminant (unless  isolated from more than one blood culture draw or clinical case suggests pathogenicity). No antibiotic treatment is indicated for blood  culture contaminants. CRITICAL RESULT CALLED TO, READ BACK BY AND VERIFIED WITH: A. MANCHERIL, RPHARMD AT 0908 ON 08/09/17 BY C. JESSUP, MLT.    Staphylococcus aureus NOT DETECTED NOT DETECTED Final   Methicillin resistance NOT DETECTED NOT DETECTED Final   Streptococcus species NOT DETECTED NOT DETECTED Final   Streptococcus agalactiae NOT DETECTED NOT DETECTED Final   Streptococcus pneumoniae NOT DETECTED NOT DETECTED Final   Streptococcus pyogenes NOT DETECTED NOT DETECTED Final   Acinetobacter baumannii NOT DETECTED NOT DETECTED Final   Enterobacteriaceae species NOT DETECTED NOT DETECTED Final   Enterobacter cloacae complex NOT DETECTED NOT DETECTED  Final   Escherichia coli NOT DETECTED NOT DETECTED Final   Klebsiella oxytoca NOT DETECTED NOT DETECTED Final   Klebsiella pneumoniae NOT DETECTED NOT DETECTED Final   Proteus species NOT DETECTED NOT DETECTED Final   Serratia marcescens NOT DETECTED NOT DETECTED Final   Haemophilus influenzae NOT DETECTED NOT DETECTED Final   Neisseria meningitidis NOT DETECTED NOT DETECTED Final   Pseudomonas aeruginosa NOT DETECTED NOT DETECTED Final   Candida albicans NOT DETECTED NOT DETECTED Final   Candida glabrata NOT DETECTED NOT DETECTED Final   Candida krusei NOT DETECTED NOT DETECTED Final   Candida parapsilosis NOT DETECTED NOT DETECTED Final   Candida tropicalis NOT DETECTED NOT DETECTED Final    Comment: Performed at [facility], [address] [place], [place] [address]  Culture, respiratory (NON-Expectorated)     Status: None   Collection Time: 08/11/17  8:46 AM  Result Value Ref Range Status   Specimen Description TRACHEAL ASPIRATE  Final   Special Requests Normal  Final   Gram Stain   Final    FEW WBC PRESENT, PREDOMINANTLY PMN NO ORGANISMS SEEN Performed at [facility], [address] [place], [place] [address]    Culture   Final    FEW PSEUDOMONAS AERUGINOSA FEW METHICILLIN RESISTANT STAPHYLOCOCCUS AUREUS    Report Status 08/14/2017 FINAL  Final   Organism ID, Bacteria PSEUDOMONAS AERUGINOSA  Final   Organism ID, Bacteria METHICILLIN RESISTANT STAPHYLOCOCCUS AUREUS  Final      Susceptibility   Methicillin resistant staphylococcus aureus - MIC*    CIPROFLOXACIN <=0.5 SENSITIVE Sensitive     ERYTHROMYCIN <=0.25 SENSITIVE Sensitive     GENTAMICIN <=0.5 SENSITIVE Sensitive     OXACILLIN RESISTANT Resistant     TETRACYCLINE <=1 SENSITIVE Sensitive     VANCOMYCIN <=0.5 SENSITIVE Sensitive     TRIMETH/SULFA <=10 SENSITIVE Sensitive     CLINDAMYCIN <=0.25 SENSITIVE Sensitive     RIFAMPIN <=0.5 SENSITIVE Sensitive     Inducible Clindamycin NEGATIVE Sensitive     * FEW METHICILLIN RESISTANT STAPHYLOCOCCUS AUREUS   Pseudomonas aeruginosa - MIC*    CEFTAZIDIME 4 SENSITIVE Sensitive     CIPROFLOXACIN <=0.25 SENSITIVE Sensitive     GENTAMICIN <=1 SENSITIVE Sensitive     IMIPENEM 2 SENSITIVE Sensitive     PIP/TAZO 8 SENSITIVE Sensitive     CEFEPIME 2 SENSITIVE Sensitive     * FEW PSEUDOMONAS AERUGINOSA    Anti-infectives:  Anti-infectives (From admission, onward)   Start     Dose/Rate Route Frequency Ordered Stop   08/14/17 2200  vancomycin (VANCOCIN) IVPB 1000 mg/200 mL premix     1,000 mg 200 mL/hr over 60 Minutes Intravenous Every 8 hours 08/14/17 1313 08/20/17 2359   08/14/17 1400  vancomycin (VANCOCIN) 1,500 mg in sodium chloride 0.9 % 500 mL IVPB     1,500 mg 250 mL/hr over 120 Minutes Intravenous  Once 08/14/17 1313 08/14/17 1608   08/08/17 0815  piperacillin-tazobactam (ZOSYN) IVPB 3.375 g  Status:  Discontinued     3.375 g 12.5 mL/hr over 240 Minutes Intravenous Every 8 hours 08/08/17 0807 08/15/17 0915      Best Practice/Protocols:  VTE Prophylaxis: Lovenox (prophylaxtic dose) Continous Sedation  Consults: Treatment Team:  Md, Trauma, MD     Studies:    Events:  Subjective:    Overnight Issues:   Objective:  Vital signs for last 24 hours: Temp:  [97.7 F (36.5 C)-99.4 F (37.4 C)] 97.7 F (36.5 C) (07/23 0800)  Pulse Rate:  [87-114] 94 (07/23 0800) Resp:  [15-27] 24 (07/23 0800) BP: (102-140)/(57-75) 125/67 (07/23 0800) SpO2:  [93 %-100 %] 98 % (07/23 0842) FiO2 (%):  [40 %] 40 % (07/23 0842) Weight:  [85.9 kg (189 lb 6 oz)] 85.9 kg (189 lb 6 oz) (07/23 0400)  Hemodynamic parameters for last 24 hours:    Intake/Output from previous day: 07/22 0701 - 07/23 0700 In: 5249 [I.V.:2645.7; NG/GT:2095; IV Piggyback:508.3] Out: 7760 [Urine:7725; Drains:35]  Intake/Output this shift: Total I/O In: 728.8 [I.V.:103.6; NG/GT:495; IV Piggyback:130.2] Out: -   Vent settings for last 24 hours: Vent Mode: CPAP;PSV FiO2 (%):  [40 %] 40 % Set Rate:  [24 bmp] 24 bmp Vt Set:  [640 mL] 640 mL PEEP:  [5 cmH20] 5 cmH20 Pressure Support:  [10 cmH20] 10 cmH20 Plateau Pressure:  [30 cmH20-31 cmH20] 30 cmH20  Physical Exam:  General: alert Neuro: alert and F/C HEENT/Neck: ETT Resp: few rhonchi CVS: regular rate and rhythm, S1, S2 normal, no murmur, click, rub or gallop GI: soft, NT, ND, incision OK, JP scant serous  Results for orders placed or performed during the hospital encounter of 08/04/17 (from the past 24 hour(s))  Glucose, capillary     Status: Abnormal   Collection Time: 08/18/17 11:29 AM  Result Value Ref Range   Glucose-Capillary 117 (H) 70 - 99 mg/dL  Blood gas, arterial     Status: Abnormal   Collection Time: 08/18/17 11:53 AM  Result Value Ref Range   FIO2 40.00    Delivery systems VENTILATOR    Mode PRESSURE SUPPORT    Peep/cpap 5.0 cm H20   Pressure support 10 cm H20   pH, Arterial 7.399 7.350 - 7.450   pCO2 arterial 53.8 (H) 32.0 - 48.0 mmHg   pO2, Arterial 84.2 83.0 - 108.0 mmHg   Bicarbonate 32.6 (H) 20.0 - 28.0 mmol/L   Acid-Base Excess 7.7 (H) 0.0 - 2.0 mmol/L   O2 Saturation 95.5 %    Patient temperature 98.6    Collection site LEFT BRACHIAL    Drawn by [id number]    Sample type ARTERIAL DRAW   Glucose, capillary     Status: Abnormal   Collection Time: 08/18/17  7:39 PM  Result Value Ref Range   Glucose-Capillary 110 (H) 70 - 99 mg/dL  Glucose, capillary     Status: Abnormal   Collection Time: 08/18/17 11:28 PM  Result Value Ref Range   Glucose-Capillary 105 (H) 70 - 99 mg/dL  Glucose, capillary     Status: None   Collection Time: 08/19/17  3:33 AM  Result Value Ref Range   Glucose-Capillary 88 70 - 99 mg/dL  CBC with Differential/Platelet     Status: Abnormal   Collection Time: 08/19/17  4:18 AM  Result Value Ref Range   WBC 15.7 (H) 4.0 - 10.5 K/uL   RBC 2.49 (L) 4.22 - 5.81 MIL/uL   Hemoglobin 7.6 (L) 13.0 - 17.0 g/dL   HCT 16.124.4 (L) 09.639.0 - 04.552.0 %   MCV 98.0 78.0 - 100.0 fL   MCH 30.5 26.0 - 34.0 pg   MCHC 31.1 30.0 - 36.0 g/dL   RDW 40.915.4 81.111.5 - 91.415.5 %   Platelets 636 (H) 150 - 400 K/uL   Neutrophils Relative % 73 %   Neutro Abs 11.5 (H) 1.7 - 7.7 K/uL   Lymphocytes Relative 11 %   Lymphs Abs 1.7 0.7 - 4.0 K/uL   Monocytes Relative 10 %   Monocytes Absolute 1.5 (H)  0.1 - 1.0 K/uL   Eosinophils Relative 4 %   Eosinophils Absolute 0.6 0.0 - 0.7 K/uL   Basophils Relative 0 %   Basophils Absolute 0.1 0.0 - 0.1 K/uL   Immature Granulocytes 2 %   Abs Immature Granulocytes 0.3 (H) 0.0 - 0.1 K/uL  Basic metabolic panel     Status: Abnormal   Collection Time: 08/19/17  4:18 AM  Result Value Ref Range   Sodium 140 135 - 145 mmol/L   Potassium 3.2 (L) 3.5 - 5.1 mmol/L   Chloride 106 98 - 111 mmol/L   CO2 28 22 - 32 mmol/L   Glucose, Bld 103 (H) 70 - 99 mg/dL   BUN 19 6 - 20 mg/dL   Creatinine, Ser 6.04 0.61 - 1.24 mg/dL   Calcium 7.9 (L) 8.9 - 10.3 mg/dL   GFR calc non Af Amer >60 >60 mL/min   GFR calc Af Amer >60 >60 mL/min   Anion gap 6 5 - 15  Glucose, capillary     Status: Abnormal   Collection Time: 08/19/17  7:48 AM  Result Value Ref Range    Glucose-Capillary 105 (H) 70 - 99 mg/dL   Comment 1 Notify RN    Comment 2 Document in Chart     Assessment & Plan: Present on Admission: **None**    LOS: 14 days   Additional comments:I reviewed the patient's new clinical lab test results. 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 Dwain Sarna 7/9,  S/P removal of packs and closure 7/10 by Dr. Janee Morn Acute hypoxic vent dependent respiratory failure/ARDS- weaned 12h yesterday, if does well today will extubate tomorrow ID- Infected ARDS, Pseud/MRSA. Has completed 7d Zosyn. Vanc d 6/7 L adrenal hematoma Acute kidney injury- resolved B hand lacerations ETOH abuse- Precedex, plan SBIRT once extubated ABL anemia FEN- lasix 40mg  X 1 again, replete hypokalemia VTE- Lovenox Dispo- ICU Critical Care Total Time*: 45 Minutes  Violeta Gelinas, MD, MPH, FACS Trauma: [phone] General Surgery: [phone]  08/19/2017  *Care during the described time interval was provided by me. I have reviewed this patient's available data, including medical history, events of note, physical examination and test results as part of my evaluation.

## 2017-08-20 LAB — GLUCOSE, CAPILLARY
GLUCOSE-CAPILLARY: 105 mg/dL — AB (ref 70–99)
GLUCOSE-CAPILLARY: 114 mg/dL — AB (ref 70–99)
GLUCOSE-CAPILLARY: 96 mg/dL (ref 70–99)
GLUCOSE-CAPILLARY: 99 mg/dL (ref 70–99)
Glucose-Capillary: 107 mg/dL — ABNORMAL HIGH (ref 70–99)
Glucose-Capillary: 107 mg/dL — ABNORMAL HIGH (ref 70–99)

## 2017-08-20 LAB — BASIC METABOLIC PANEL
ANION GAP: 7 (ref 5–15)
BUN: 22 mg/dL — ABNORMAL HIGH (ref 6–20)
CALCIUM: 8.1 mg/dL — AB (ref 8.9–10.3)
CO2: 27 mmol/L (ref 22–32)
CREATININE: 0.84 mg/dL (ref 0.61–1.24)
Chloride: 106 mmol/L (ref 98–111)
GFR calc Af Amer: >60 mL/min (ref >60)
GFR calc non Af Amer: >60 mL/min (ref >60)
GLUCOSE: 107 mg/dL — AB (ref 70–99)
Potassium: 3.7 mmol/L (ref 3.5–5.1)
Sodium: 140 mmol/L (ref 135–145)

## 2017-08-20 LAB — CBC WITH DIFFERENTIAL/PLATELET
Abs Immature Granulocytes: 0.2 10*3/uL — ABNORMAL HIGH (ref 0.0–0.1)
BASOS ABS: 0.1 10*3/uL (ref 0.0–0.1)
BASOS PCT: 1 %
EOS ABS: 0.7 10*3/uL (ref 0.0–0.7)
Eosinophils Relative: 4 %
HCT: 26.7 % — ABNORMAL LOW (ref 39.0–52.0)
Hemoglobin: 8.4 g/dL — ABNORMAL LOW (ref 13.0–17.0)
Immature Granulocytes: 1 %
Lymphocytes Relative: 8 %
Lymphs Abs: 1.4 10*3/uL (ref 0.7–4.0)
MCH: 30.9 pg (ref 26.0–34.0)
MCHC: 31.5 g/dL (ref 30.0–36.0)
MCV: 98.2 fL (ref 78.0–100.0)
MONO ABS: 1.8 10*3/uL — AB (ref 0.1–1.0)
MONOS PCT: 11 %
NEUTROS PCT: 75 %
Neutro Abs: 12.6 10*3/uL — ABNORMAL HIGH (ref 1.7–7.7)
PLATELETS: 728 10*3/uL — AB (ref 150–400)
RBC: 2.72 MIL/uL — ABNORMAL LOW (ref 4.22–5.81)
RDW: 15.5 % (ref 11.5–15.5)
WBC: 16.8 10*3/uL — ABNORMAL HIGH (ref 4.0–10.5)

## 2017-08-20 MED ORDER — WHITE PETROLATUM EX OINT
TOPICAL_OINTMENT | CUTANEOUS | Status: AC
  Administered 2017-08-20: 0.2
  Filled 2017-08-20: qty 28.35

## 2017-08-20 MED ORDER — FENTANYL CITRATE (PF) 100 MCG/2ML IJ SOLN: 50.0000 ug | INTRAMUSCULAR | Status: DC | PRN

## 2017-08-20 NOTE — Procedures (Signed)
Extubation Procedure Note  Patient Details:   Name: Jeffrey GottronDavid Mahar DOB: 02/26/1985 MRN: [id number]   Airway Documentation:    Vent end date: 08/20/17 Vent end time: 0858   Evaluation  O2 sats: stable throughout Complications: No apparent complications Patient did tolerate procedure well. Bilateral Breath Sounds: Rhonchi   Yes pt able to whisper.   Pt extubated at this time per MD order. Pt was able to breathe around deflated cuff. No stridor noted.  Pt has adequate cough. IS performed with 14025mLx5. Placed on 6L [place] and tolerating well at this time.   Loyal Jacobsonhompson, Sammi Stolarz [facility] Center[facility] 08/20/2017, 9:03 AM
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
## 2017-08-21 LAB — GLUCOSE, CAPILLARY
GLUCOSE-CAPILLARY: 95 mg/dL (ref 70–99)
Glucose-Capillary: 106 mg/dL — ABNORMAL HIGH (ref 70–99)
Glucose-Capillary: 92 mg/dL (ref 70–99)

## 2017-08-21 MED ORDER — ADULT MULTIVITAMIN W/MINERALS CH
1.0000 | ORAL_TABLET | Freq: Every day | ORAL | Status: DC
  Administered 2017-08-22 – 2017-08-25 (×3): 1 via ORAL
  Filled 2017-08-21 (×4): qty 1

## 2017-08-21 MED ORDER — POLYETHYLENE GLYCOL 3350 17 G PO PACK
17.0000 g | PACK | Freq: Every day | ORAL | Status: DC
  Administered 2017-08-22 – 2017-08-24 (×3): 17 g via ORAL
  Filled 2017-08-21 (×4): qty 1

## 2017-08-21 MED ORDER — CLONAZEPAM 1 MG PO TABS
2.0000 mg | ORAL_TABLET | Freq: Three times a day (TID) | ORAL | Status: DC
  Administered 2017-08-21 – 2017-08-24 (×10): 2 mg via ORAL
  Filled 2017-08-21 (×4): qty 2
  Filled 2017-08-21: qty 4
  Filled 2017-08-21 (×5): qty 2

## 2017-08-21 MED ORDER — FREE WATER: 200.0000 mL | Freq: Three times a day (TID) | Status: DC

## 2017-08-21 MED ORDER — GUAIFENESIN 100 MG/5ML PO SOLN
5.0000 mL | Freq: Three times a day (TID) | ORAL | Status: DC
  Administered 2017-08-21 – 2017-08-26 (×13): 100 mg via ORAL
  Filled 2017-08-21 (×2): qty 5
  Filled 2017-08-21 (×8): qty 10
  Filled 2017-08-21 (×3): qty 5

## 2017-08-21 MED ORDER — QUETIAPINE FUMARATE 200 MG PO TABS
400.0000 mg | ORAL_TABLET | Freq: Two times a day (BID) | ORAL | Status: DC
  Administered 2017-08-21: 400 mg via ORAL
  Filled 2017-08-21: qty 2

## 2017-08-21 MED ORDER — DOCUSATE SODIUM 100 MG PO CAPS
100.0000 mg | ORAL_CAPSULE | Freq: Two times a day (BID) | ORAL | Status: DC
  Administered 2017-08-22 – 2017-08-24 (×3): 100 mg via ORAL
  Filled 2017-08-21 (×6): qty 1

## 2017-08-21 MED ORDER — FOLIC ACID 1 MG PO TABS
1.0000 mg | ORAL_TABLET | Freq: Every day | ORAL | Status: DC
  Administered 2017-08-22 – 2017-08-25 (×4): 1 mg via ORAL
  Filled 2017-08-21 (×4): qty 1

## 2017-08-21 MED ORDER — VITAMIN B-1 100 MG PO TABS
100.0000 mg | ORAL_TABLET | Freq: Every day | ORAL | Status: DC
  Administered 2017-08-22 – 2017-08-25 (×4): 100 mg via ORAL
  Filled 2017-08-21 (×4): qty 1

## 2017-08-21 MED ORDER — FREE WATER
200.0000 mL | Freq: Three times a day (TID) | Status: DC
  Administered 2017-08-21 – 2017-08-24 (×5): 200 mL via ORAL

## 2017-08-21 MED ORDER — PANTOPRAZOLE SODIUM 40 MG PO TBEC
40.0000 mg | DELAYED_RELEASE_TABLET | Freq: Every day | ORAL | Status: DC
  Administered 2017-08-22 – 2017-08-25 (×4): 40 mg via ORAL
  Filled 2017-08-21 (×4): qty 1

## 2017-08-21 MED ORDER — SODIUM CHLORIDE 0.9 % IV SOLN: INTRAVENOUS | Status: DC

## 2017-08-21 MED ORDER — ACETAMINOPHEN 325 MG PO TABS: 650.0000 mg | ORAL_TABLET | ORAL | Status: DC | PRN

## 2017-08-21 MED ORDER — ENSURE ENLIVE PO LIQD
237.0000 mL | Freq: Three times a day (TID) | ORAL | Status: DC
  Administered 2017-08-22: 237 mL via ORAL
  Administered 2017-08-23: 19:00:00 via ORAL
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
## 2017-08-21 NOTE — Progress Notes (Signed)
30 cc IV Versed wasted in sink, witnessed by Everette RankJennifer Carmichael, RN
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
## 2017-08-21 NOTE — Progress Notes (Signed)
Rehab Admissions Coordinator Note:  Per PT and OT recommendation, patient was screened by Nanine MeansKelly Madai Nuccio for appropriateness for an Inpatient Acute Rehab Consult.  At this time, we are recommending Inpatient Rehab consult. AC will contact Trauma MD for IP rehab consult order.   Nanine MeansKelly Trenise Turay 08/21/2017, 2:35 PM  I can be reached at [phone].
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
## 2017-08-22 ENCOUNTER — Inpatient Hospital Stay (HOSPITAL_COMMUNITY): Payer: Self-pay

## 2017-08-22 DIAGNOSIS — S36113A Laceration of liver, unspecified degree, initial encounter: Secondary | ICD-10-CM

## 2017-08-22 DIAGNOSIS — R000 Tachycardia, unspecified: Secondary | ICD-10-CM

## 2017-08-22 DIAGNOSIS — R0682 Tachypnea, not elsewhere classified: Secondary | ICD-10-CM

## 2017-08-22 DIAGNOSIS — J939 Pneumothorax, unspecified: Secondary | ICD-10-CM

## 2017-08-22 DIAGNOSIS — L8990 Pressure ulcer of unspecified site, unspecified stage: Secondary | ICD-10-CM

## 2017-08-22 DIAGNOSIS — Z720 Tobacco use: Secondary | ICD-10-CM

## 2017-08-22 DIAGNOSIS — S299XXA Unspecified injury of thorax, initial encounter: Secondary | ICD-10-CM

## 2017-08-22 DIAGNOSIS — F1010 Alcohol abuse, uncomplicated: Secondary | ICD-10-CM

## 2017-08-22 DIAGNOSIS — D72829 Elevated white blood cell count, unspecified: Secondary | ICD-10-CM

## 2017-08-22 DIAGNOSIS — D62 Acute posthemorrhagic anemia: Secondary | ICD-10-CM

## 2017-08-22 DIAGNOSIS — S31109A Unspecified open wound of abdominal wall, unspecified quadrant without penetration into peritoneal cavity, initial encounter: Secondary | ICD-10-CM

## 2017-08-22 DIAGNOSIS — E876 Hypokalemia: Secondary | ICD-10-CM

## 2017-08-22 DIAGNOSIS — N179 Acute kidney failure, unspecified: Secondary | ICD-10-CM

## 2017-08-22 DIAGNOSIS — I10 Essential (primary) hypertension: Secondary | ICD-10-CM

## 2017-08-22 DIAGNOSIS — S3600XA Unspecified injury of spleen, initial encounter: Secondary | ICD-10-CM

## 2017-08-22 LAB — CBC
HEMATOCRIT: 28.9 % — AB (ref 39.0–52.0)
HEMOGLOBIN: 9 g/dL — AB (ref 13.0–17.0)
MCH: 30.5 pg (ref 26.0–34.0)
MCHC: 31.1 g/dL (ref 30.0–36.0)
MCV: 98 fL (ref 78.0–100.0)
PLATELETS: 773 10*3/uL — AB (ref 150–400)
RBC: 2.95 MIL/uL — AB (ref 4.22–5.81)
RDW: 14.5 % (ref 11.5–15.5)
WBC: 22.6 10*3/uL — ABNORMAL HIGH (ref 4.0–10.5)

## 2017-08-22 LAB — BASIC METABOLIC PANEL
Anion gap: 10 (ref 5–15)
BUN: 16 mg/dL (ref 6–20)
CHLORIDE: 101 mmol/L (ref 98–111)
CO2: 29 mmol/L (ref 22–32)
Calcium: 8.5 mg/dL — ABNORMAL LOW (ref 8.9–10.3)
Creatinine, Ser: 1.03 mg/dL (ref 0.61–1.24)
GFR calc Af Amer: >60 mL/min (ref >60)
GFR calc non Af Amer: >60 mL/min (ref >60)
GLUCOSE: 108 mg/dL — AB (ref 70–99)
POTASSIUM: 3.3 mmol/L — AB (ref 3.5–5.1)
Sodium: 140 mmol/L (ref 135–145)

## 2017-08-22 MED ORDER — ORAL CARE MOUTH RINSE
15.0000 mL | Freq: Two times a day (BID) | OROMUCOSAL | Status: DC
  Administered 2017-08-22 – 2017-08-25 (×4): 15 mL via OROMUCOSAL

## 2017-08-22 MED ORDER — POTASSIUM CHLORIDE CRYS ER 20 MEQ PO TBCR
20.0000 meq | EXTENDED_RELEASE_TABLET | Freq: Two times a day (BID) | ORAL | Status: AC
  Administered 2017-08-22: 20 meq via ORAL
  Filled 2017-08-22 (×2): qty 1

## 2017-08-22 MED ORDER — QUETIAPINE FUMARATE 100 MG PO TABS
200.0000 mg | ORAL_TABLET | Freq: Two times a day (BID) | ORAL | Status: DC
  Administered 2017-08-22 – 2017-08-24 (×6): 200 mg via ORAL
  Filled 2017-08-22 (×4): qty 2
  Filled 2017-08-22: qty 1
  Filled 2017-08-22: qty 2
  Filled 2017-08-22: qty 1

## 2017-08-22 MED ORDER — POTASSIUM CHLORIDE 10 MEQ/100ML IV SOLN
10.0000 meq | INTRAVENOUS | Status: AC
  Administered 2017-08-22 (×2): 10 meq via INTRAVENOUS
  Filled 2017-08-22 (×2): qty 100
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
## 2017-08-22 NOTE — Progress Notes (Signed)
Pt vomited 200ml after taking sip of water and half of potassium pill. Dr. Janee Mornhompson aware, verbal order for IV potassium and diet change to clear liquids.

## 2017-08-22 NOTE — Progress Notes (Signed)
Inpatient Rehabilitation  Briefly met with patient at bedside to discuss team's recommendation for IP Rehab.  Patient getting ready to work with PT and OT for session.  Left booklets, insurance verification letter, and plan to follow up Monday, 7/29 to answer questions.  Will follow for timing of medical readiness, patient's decision, and IP Rehab bed availability.  Call if questions.     , M.A., CCC/SLP Admission Coordinator  [facility]  Cell [phone]  
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
## 2017-08-22 NOTE — Plan of Care (Signed)
Pt ambulated in hallway with PT/OT. Pt able to stand and pivot from chair to bed. Pt tolerated sitting in chair for 90 minutes. Pt pleasant and involved in care.
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
## 2017-08-24 ENCOUNTER — Inpatient Hospital Stay (HOSPITAL_COMMUNITY): Payer: Self-pay

## 2017-08-24 LAB — CBC
HEMATOCRIT: 30.8 % — AB (ref 39.0–52.0)
Hemoglobin: 9.8 g/dL — ABNORMAL LOW (ref 13.0–17.0)
MCH: 30.7 pg (ref 26.0–34.0)
MCHC: 31.8 g/dL (ref 30.0–36.0)
MCV: 96.6 fL (ref 78.0–100.0)
PLATELETS: 770 10*3/uL — AB (ref 150–400)
RBC: 3.19 MIL/uL — ABNORMAL LOW (ref 4.22–5.81)
RDW: 13.8 % (ref 11.5–15.5)
WBC: 21.6 10*3/uL — ABNORMAL HIGH (ref 4.0–10.5)

## 2017-08-24 MED ORDER — IPRATROPIUM-ALBUTEROL 0.5-2.5 (3) MG/3ML IN SOLN
3.0000 mL | Freq: Three times a day (TID) | RESPIRATORY_TRACT | Status: DC
  Administered 2017-08-25: 3 mL via RESPIRATORY_TRACT
  Filled 2017-08-24 (×3): qty 3

## 2017-08-24 MED ORDER — FENTANYL 100 MCG/HR TD PT72
100.0000 ug | MEDICATED_PATCH | TRANSDERMAL | Status: DC
  Administered 2017-08-24: 100 ug via TRANSDERMAL
  Filled 2017-08-24: qty 1
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
## 2017-08-24 NOTE — Progress Notes (Signed)
Patient's mom found me earlier this morning and asked if I could come and talk to patient. She states that she "heard him sniffling and realized that he was crying". Per mom, patient expressed to her "sadness and guilt".   I told mom that I would come and talk to patient.   In conversing patient states that he is "ready to go home".  Nurse assessed notes and recommendation to CIR also notes from PT 7/266/19 that patient has ambulated with walker down the hallway but become diaphoretic and needed to return.   Patient states that he does have 24/7 help at home and does not want to go to CIR but just wants to be done with the hospital experience.  Nurse educated patient on CIR, needs related to notes and that on tomorrow he would be reassessed by PT to see if any progress has been made.   Patient is flat affect.

## 2017-08-24 NOTE — Progress Notes (Signed)
Patient ambulated in hall. 1 assist with walker. Patient became slightly diaphoretic and we returned to bed. Total ambulation was half of hallway in unit. Upon returning to bed, he was slightly shaky and claimed to feel dizzy. Patient agreed to sit in chair early in the morning to help increase stamina and at least get out of bed.

## 2017-08-25 LAB — CBC WITH DIFFERENTIAL/PLATELET
Abs Immature Granulocytes: 0.2 10*3/uL — ABNORMAL HIGH (ref 0.0–0.1)
BASOS ABS: 0.2 10*3/uL — AB (ref 0.0–0.1)
BASOS PCT: 1 %
EOS ABS: 1 10*3/uL — AB (ref 0.0–0.7)
Eosinophils Relative: 5 %
HEMATOCRIT: 31.7 % — AB (ref 39.0–52.0)
Hemoglobin: 10.1 g/dL — ABNORMAL LOW (ref 13.0–17.0)
IMMATURE GRANULOCYTES: 1 %
LYMPHS ABS: 2.4 10*3/uL (ref 0.7–4.0)
Lymphocytes Relative: 13 %
MCH: 30.4 pg (ref 26.0–34.0)
MCHC: 31.9 g/dL (ref 30.0–36.0)
MCV: 95.5 fL (ref 78.0–100.0)
Monocytes Absolute: 2.2 10*3/uL — ABNORMAL HIGH (ref 0.1–1.0)
Monocytes Relative: 12 %
NEUTROS PCT: 68 %
Neutro Abs: 13.1 10*3/uL — ABNORMAL HIGH (ref 1.7–7.7)
PLATELETS: 705 10*3/uL — AB (ref 150–400)
RBC: 3.32 MIL/uL — AB (ref 4.22–5.81)
RDW: 13.8 % (ref 11.5–15.5)
WBC: 19 10*3/uL — ABNORMAL HIGH (ref 4.0–10.5)

## 2017-08-25 LAB — URINALYSIS, ROUTINE W REFLEX MICROSCOPIC
BILIRUBIN URINE: NEGATIVE
Glucose, UA: NEGATIVE mg/dL
Hgb urine dipstick: NEGATIVE
Ketones, ur: NEGATIVE mg/dL
Leukocytes, UA: NEGATIVE
NITRITE: NEGATIVE
PROTEIN: NEGATIVE mg/dL
SPECIFIC GRAVITY, URINE: 1.012 (ref 1.005–1.030)
pH: 7 (ref 5.0–8.0)

## 2017-08-25 MED ORDER — QUETIAPINE FUMARATE 50 MG PO TABS: 50.0000 mg | ORAL_TABLET | Freq: Two times a day (BID) | ORAL | Status: DC

## 2017-08-25 MED ORDER — QUETIAPINE FUMARATE 100 MG PO TABS: 100.0000 mg | ORAL_TABLET | Freq: Every day | ORAL | Status: DC
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
## 2017-08-25 NOTE — Progress Notes (Signed)
Inpatient Rehabilitation  Met with patient at bedside to further discuss post acute rehab needs.  Patient reports that he is eager to return home and has the family support needed.  Will sing off at this time.  Call if questions or a functional decline takes place.   Carmelia Roller., CCC/SLP Admission Coordinator  [place]  Cell [phone]
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
## 2017-08-25 NOTE — Plan of Care (Signed)
  Problem: Education: Goal: Knowledge of General Education information will improve Outcome: Progressing   Problem: Health Behavior/Discharge Planning: Goal: Ability to manage health-related needs will improve Outcome: Progressing   Problem: Clinical Measurements: Goal: Ability to maintain clinical measurements within normal limits will improve Outcome: Progressing   Problem: Clinical Measurements: Goal: Will remain free from infection Outcome: Progressing   Problem: Clinical Measurements: Goal: Diagnostic test results will improve Outcome: Progressing   Problem: Clinical Measurements: Goal: Respiratory complications will improve Outcome: Progressing   Problem: Clinical Measurements: Goal: Cardiovascular complication will be avoided Outcome: Progressing   
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
## 2017-08-26 ENCOUNTER — Encounter (HOSPITAL_COMMUNITY): Payer: Self-pay | Admitting: *Deleted

## 2017-08-26 ENCOUNTER — Other Ambulatory Visit: Payer: Self-pay

## 2017-08-26 MED ORDER — PNEUMOCOCCAL VAC POLYVALENT 25 MCG/0.5ML IJ INJ
0.5000 mL | INJECTION | INTRAMUSCULAR | Status: AC
  Administered 2017-08-26: 0.5 mL via INTRAMUSCULAR

## 2017-08-26 MED ORDER — GUAIFENESIN 100 MG/5ML PO SOLN: 5.0000 mL | Freq: Three times a day (TID) | ORAL | 0 refills | Status: AC

## 2017-08-26 MED ORDER — OXYCODONE HCL 5 MG PO TABS: 5.0000 mg | ORAL_TABLET | Freq: Four times a day (QID) | ORAL | 0 refills | Status: AC | PRN

## 2017-08-26 MED ORDER — HAEMOPHILUS B POLYSAC CONJ VAC 10 MCG IJ SOLR
0.5000 mL | Freq: Once | INTRAMUSCULAR | Status: AC
  Administered 2017-08-26: 0.5 mL via INTRAMUSCULAR
  Filled 2017-08-26: qty 0.5

## 2017-08-26 MED ORDER — ACETAMINOPHEN 325 MG PO TABS: 650.0000 mg | ORAL_TABLET | ORAL | Status: AC | PRN

## 2017-08-26 MED ORDER — MENINGOCOCCAL A C Y&W-135 OLIG IM SOLR
0.5000 mL | Freq: Once | INTRAMUSCULAR | Status: AC
  Administered 2017-08-26: 0.5 mL via INTRAMUSCULAR
  Filled 2017-08-26: qty 0.5

## 2017-08-26 MED ORDER — IPRATROPIUM-ALBUTEROL 0.5-2.5 (3) MG/3ML IN SOLN: 3.0000 mL | RESPIRATORY_TRACT | Status: DC | PRN
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
## 2017-08-26 NOTE — Discharge Instructions (Signed)
[facility]      [facility] °[phone] ° °OPEN ABDOMINAL SURGERY: POST OP INSTRUCTIONS ° °Always review your discharge instruction sheet given to you by the facility where your surgery was performed. ° °IF YOU HAVE DISABILITY OR FAMILY LEAVE FORMS, YOU MUST BRING THEM TO THE OFFICE FOR PROCESSING.  PLEASE DO NOT GIVE THEM TO YOUR DOCTOR. ° °1. A prescription for pain medication may be given to you upon discharge.  Take your pain medication as prescribed, if needed.  If narcotic pain medicine is not needed, then you may take acetaminophen (Tylenol) or ibuprofen (Advil) as needed. °2. Take your usually prescribed medications unless otherwise directed. °3. If you need a refill on your pain medication, please contact your pharmacy. They will contact our office to request authorization.  Prescriptions will not be filled after 5pm or on week-ends. °4. You should follow a light diet the first few days after arrival home, such as soup and crackers, pudding, etc.unless your doctor has advised otherwise. A high-fiber, low fat diet can be resumed as tolerated.   Be sure to include lots of fluids daily. Most patients will experience some swelling and bruising on the chest and neck area.  Ice packs will help.  Swelling and bruising can take several days to resolve °5. Most patients will experience some swelling and bruising in the area of the incision. Ice pack will help. Swelling and bruising can take several days to resolve..  °6. It is common to experience some constipation if taking pain medication after surgery.  Increasing fluid intake and taking a stool softener will usually help or prevent this problem from occurring.  A mild laxative (Milk of Magnesia or Miralax) should be taken according to package directions if there are no bowel movements after 48 hours. °7.  You may have steri-strips (small skin tapes) in place directly over the incision.  These strips should be left on the skin for 7-10 days.  If your  surgeon used skin glue on the incision, you may shower in 24 hours.  The glue will flake off over the next 2-3 weeks.  Any sutures or staples will be removed at the office during your follow-up visit. You may find that a light gauze bandage over your incision may keep your staples from being rubbed or pulled. You may shower and replace the bandage daily. °8. ACTIVITIES:  You may resume regular (light) daily activities beginning the next day--such as daily self-care, walking, climbing stairs--gradually increasing activities as tolerated.  You may have sexual intercourse when it is comfortable.  Refrain from any heavy lifting or straining until approved by your doctor. °a. You may drive when you no longer are taking prescription pain medication, you can comfortably wear a seatbelt, and you can safely maneuver your car and apply brakes °b. Return to Work: ___________________________________ °9. You should see your doctor in the office for a follow-up appointment approximately two weeks after your surgery.  Make sure that you call for this appointment within a day or two after you arrive home to insure a convenient appointment time. °OTHER INSTRUCTIONS:  °_____________________________________________________________ °_____________________________________________________________ ° °WHEN TO CALL YOUR DOCTOR: °1. Fever over 101.0 °2. Inability to urinate °3. Nausea and/or vomiting °4. Extreme swelling or bruising °5. Continued bleeding from incision. °6. Increased pain, redness, or drainage from the incision. °7. Difficulty swallowing or breathing °8. Muscle cramping or spasms. °9. Numbness or tingling in hands or feet or around lips. ° °The clinic staff is available to   answer your questions during regular business hours.  Please dont hesitate to call and ask to speak to one of the nurses if you have concerns.  For further questions, please visit [email or web address]  Pneumococcal Conjugate Vaccine  suspension for injection, PLEASE OBTAIN THIS VACCINE FROM YOUR PRIMARY DOCTOR IN ABOUT 2 MONTHS What is this medicine? PNEUMOCOCCAL VACCINE (NEU mo KOK al vak SEEN) is a vaccine used to prevent pneumococcus bacterial infections. These bacteria can cause serious infections like pneumonia, meningitis, and blood infections. This vaccine will lower your chance of getting pneumonia. If you do get pneumonia, it can make your symptoms milder and your illness shorter. This vaccine will not treat an infection and will not cause infection. This vaccine is recommended for infants and young children, adults with certain medical conditions, and adults 65 years or older. This medicine may be used for other purposes; ask your health care provider or pharmacist if you have questions. COMMON BRAND NAME(S): Prevnar, Prevnar 13 What should I tell my health care provider before I take this medicine? They need to know if you have any of these conditions: -bleeding problems -fever -immune system problems -an unusual or allergic reaction to pneumococcal vaccine, diphtheria toxoid, other vaccines, latex, other medicines, foods, dyes, or preservatives -pregnant or trying to get pregnant -breast-feeding How should I use this medicine? This vaccine is for injection into a muscle. It is given by a health care professional. A copy of Vaccine Information Statements will be given before each vaccination. Read this sheet carefully each time. The sheet may change frequently. Talk to your pediatrician regarding the use of this medicine in children. While this drug may be prescribed for children as young as 536 weeks old for selected conditions, precautions do apply. Overdosage: If you think you have taken too much of this medicine contact a poison control center or emergency room at once. NOTE: This medicine is only for you. Do not share this medicine with others. What if I miss a dose? It is important not to miss your dose. Call  your doctor or health care professional if you are unable to keep an appointment. What may interact with this medicine? -medicines for cancer chemotherapy -medicines that suppress your immune function -steroid medicines like prednisone or cortisone This list may not describe all possible interactions. Give your health care provider a list of all the medicines, herbs, non-prescription drugs, or dietary supplements you use. Also tell them if you smoke, drink alcohol, or use illegal drugs. Some items may interact with your medicine. What should I watch for while using this medicine? Mild fever and pain should go away in 3 days or less. Report any unusual symptoms to your doctor or health care professional. What side effects may I notice from receiving this medicine? Side effects that you should report to your doctor or health care professional as soon as possible: -allergic reactions like skin rash, itching or hives, swelling of the face, lips, or tongue -breathing problems -confused -fast or irregular heartbeat -fever over 102 degrees F -seizures -unusual bleeding or bruising -unusual muscle weakness Side effects that usually do not require medical attention (report to your doctor or health care professional if they continue or are bothersome): -aches and pains -diarrhea -fever of 102 degrees F or less -headache -irritable -loss of appetite -pain, tender at site where injected -trouble sleeping This list may not describe all possible side effects. Call your doctor for medical advice about side effects. You may report side effects to FDA  at 1-800-FDA-1088. Where should I keep my medicine? This does not apply. This vaccine is given in a clinic, pharmacy, doctor's office, or other health care setting and will not be stored at home. NOTE: This sheet is a summary. It may not cover all possible information. If you have questions about this medicine, talk to your doctor, pharmacist, or health  care provider.  2018 Elsevier/Gold Standard (2013-10-21 10:27:27)

## 2017-09-09 ENCOUNTER — Telehealth (HOSPITAL_COMMUNITY): Payer: Self-pay

## 2017-09-09 NOTE — Telephone Encounter (Signed)
Appointment made for 8/20 at 10:30AM. Our office will call to notify patient of new appointment.

## 2017-09-09 NOTE — Telephone Encounter (Signed)
Pt. called to say he needed to reschedule his appt. that was scheduled for today. Please call him at [phone].  Thanks, Gloris Manchesterraci  [phone]

## 2019-09-02 IMAGING — CT CT ABD-PELV W/ CM
2 of 5 series · 14 of 46 positions shown, 16 images · IV contrast (omnipaque)
Comparison: None.

CLINICAL DATA: Blunt abdominal trauma.

EXAM:
CT CHEST, ABDOMEN, AND PELVIS WITH CONTRAST
TECHNIQUE: Multidetector CT imaging of the chest, abdomen and pelvis was
performed following the standard protocol during bolus
administration of intravenous contrast.
CONTRAST:  100mL OMNIPAQUE IOHEXOL 300 MG/ML  SOLN

[Series 3: cap with · axial · 0.66mm/px · z∈[+252,+842]mm · 11 of 142 slices shown, 13 images]
[im 12/142  soft-tissue]
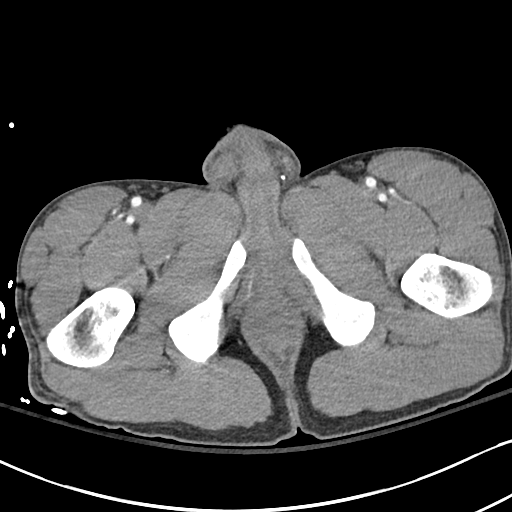
[im 12/142  bone]
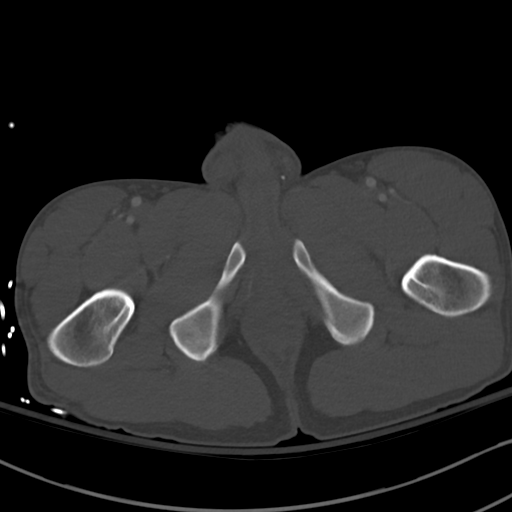
[im 24/142  soft-tissue]
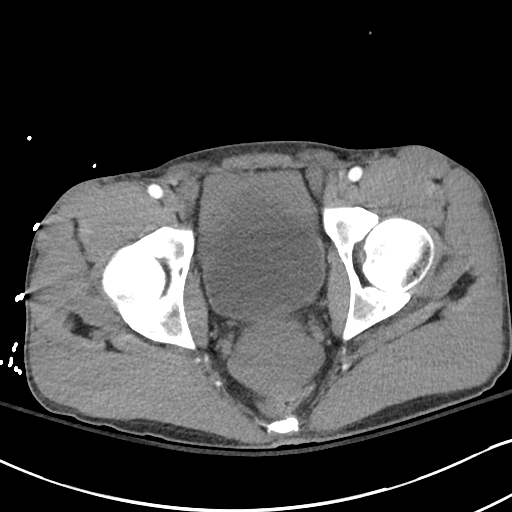
[im 36/142  soft-tissue]
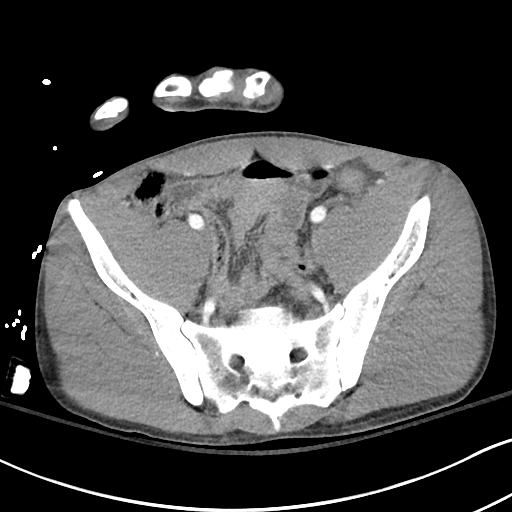
[im 48/142  soft-tissue]
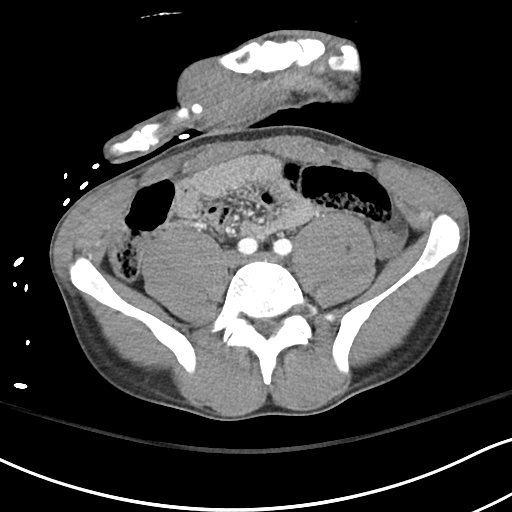
[im 59/142  soft-tissue]
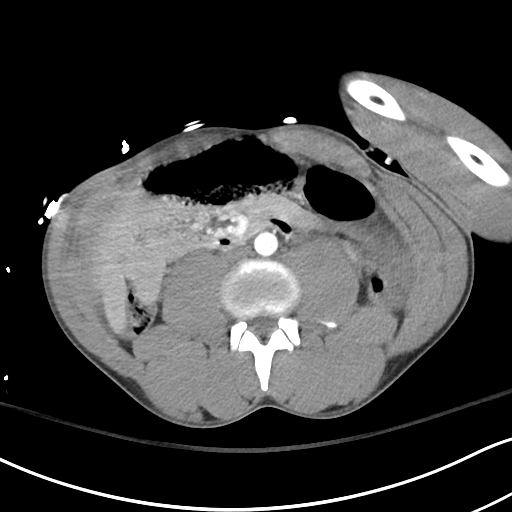
[im 71/142  soft-tissue]
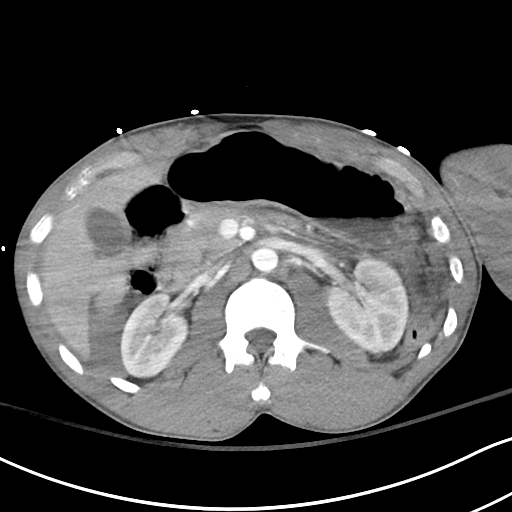
[im 83/142  soft-tissue]
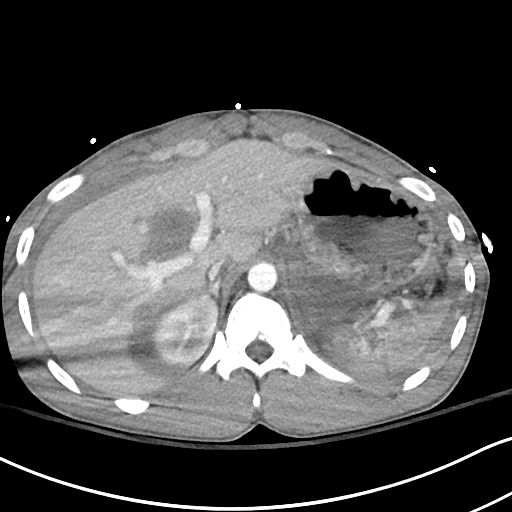
[im 95/142  soft-tissue]
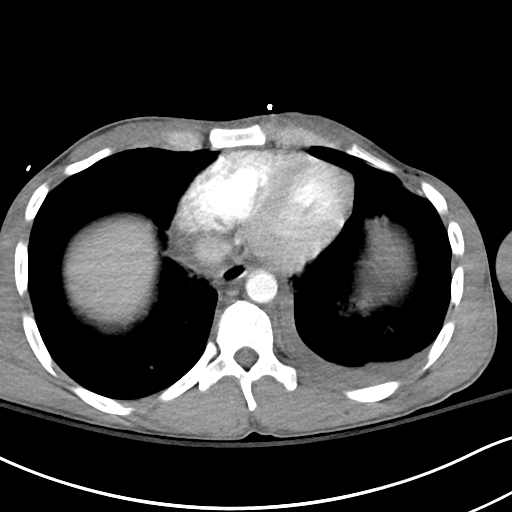
[im 106/142  soft-tissue]
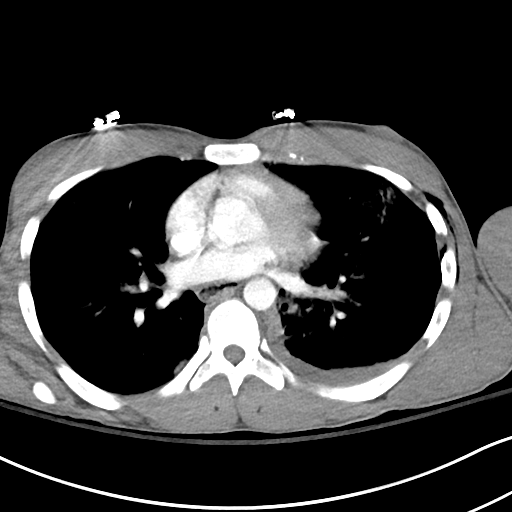
[im 106/142  bone]
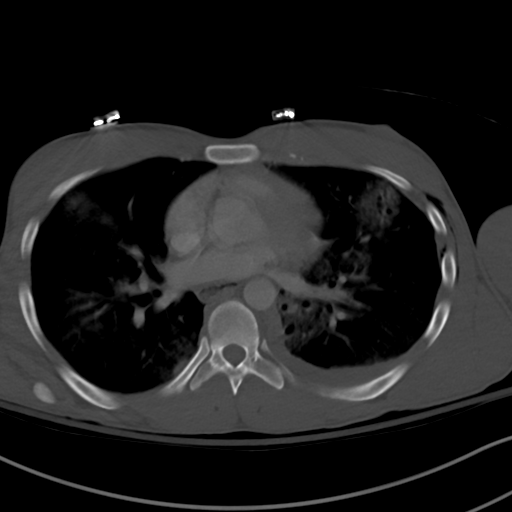
[im 118/142  soft-tissue]
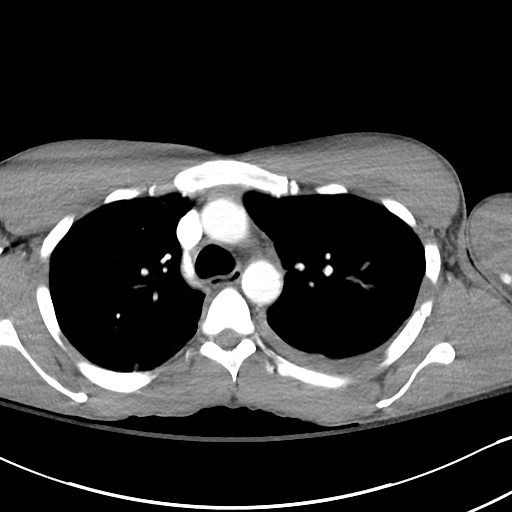
[im 130/142  soft-tissue]
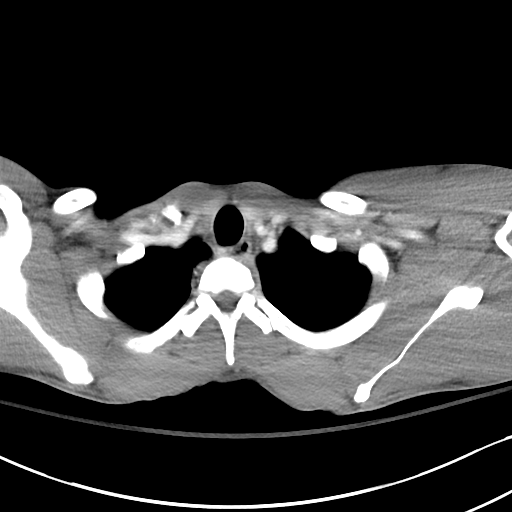

[Series 6: cor · coronal · 0.75mm/px · 3 of 83 slices shown]
[im 28/83  soft-tissue]
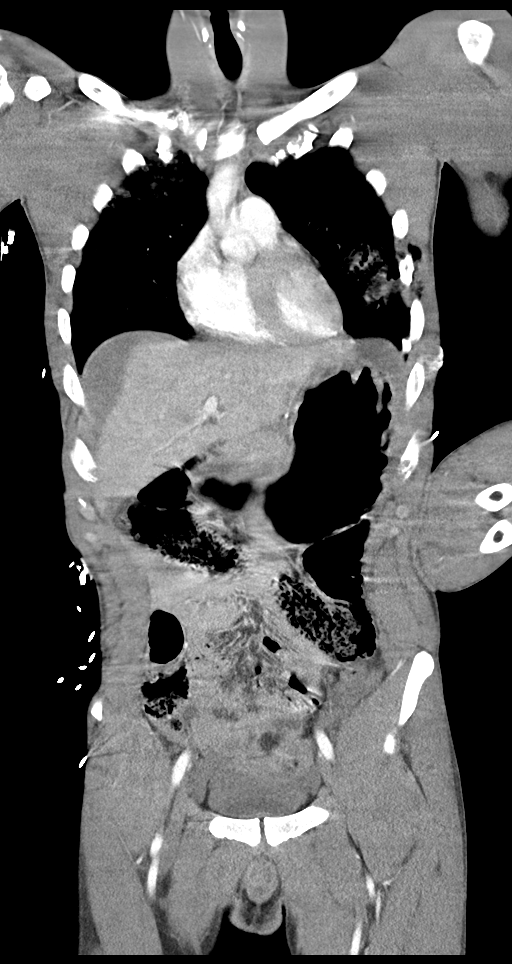
[im 37/83  soft-tissue]
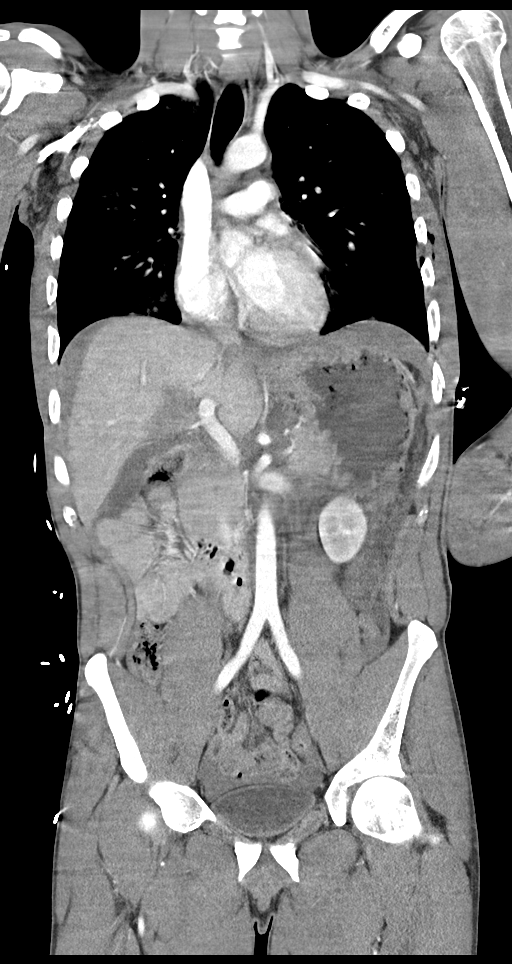
[im 46/83  soft-tissue]
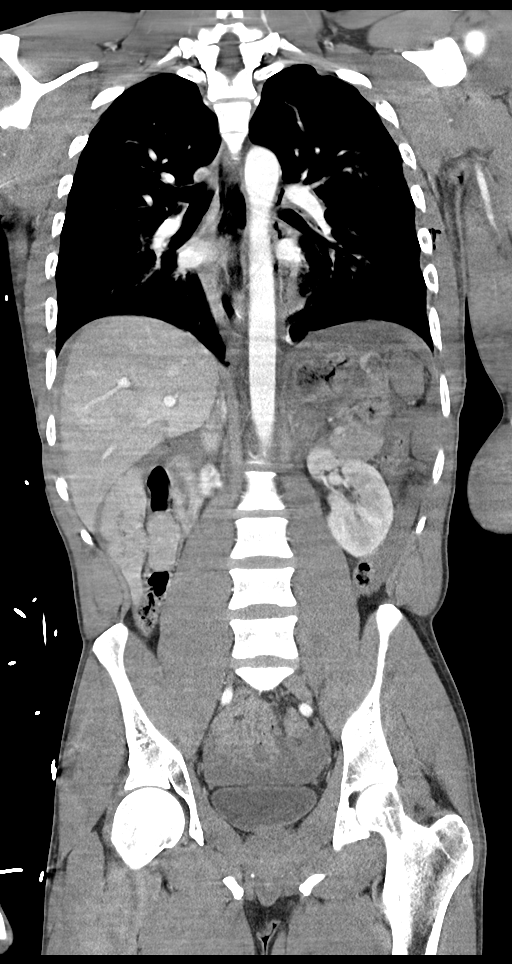

[14 of 46 positions shown; findings below may reference images not displayed]

FINDINGS: CT CHEST FINDINGS

Cardiovascular: Normal heart size. No pericardial effusion. No
evidence of great vessel injury.

Mediastinum/Nodes: Negative for pneumomediastinum. Mild debris
within the esophagus.

Lungs/Pleura: Patchy ground-glass opacity in the bilateral lungs
with multiple pneumatocele is a. Small left hemopneumothorax.

Musculoskeletal: See below

CT ABDOMEN PELVIS FINDINGS

Hepatobiliary: Shallow linear laceration in the lateral segment left
liver and posterior right lobe. Central hepatic laceration extending
to the portal vein bifurcation with a 5 cm central hematoma. No
active hemorrhage is seen.

Pancreas: No visible injury.

Spleen: Severe injury with extensive devitalization. There is active
hemorrhage with hemoperitoneum reaching the pelvis.

Adrenals/Urinary Tract: Left adrenal indistinctness with hematoma
that shows enhancement only in the delayed phase, consistent with
mild bleeding. Negative right adrenal. No visible renal injury.
Negative bladder.

Stomach/Bowel: No visible bowel wall thickening or perforation. No
noted mesenteric contusion.

Vascular/Lymphatic: Flat IVC compatible with shock.. No aortic or
major mesenteric injury is seen.

Reproductive: Negative

Other: Negative for pneumoperitoneum

Musculoskeletal: Left third through ninth rib fractures with up to
moderate displacement.

Nondepressed or displaced transverse lower sternal body fracture
seen on sagittal images

Critical Value/emergent results were called by telephone at the time
of interpretation on 08/05/2017 at [DATE] to Dr. NATUK PYNDT
, who verbally acknowledged these results.
IMPRESSION: 1. Shattered spleen with brisk active hemorrhage. Hemoperitoneum
reaches the pelvis.
2. Grade 3 liver laceration with central hematoma measuring up to 5
cm.
3. Left adrenal hematoma with mild active bleeding on the delayed
phase.
4. Small left hemopneumothorax.
5. Multifocal bilateral pulmonary contusion and laceration.
6. Left third through ninth rib fractures.
7. Nondisplaced lower sternal body fracture.
